# Patient Record
Sex: Male | Born: 1960 | Race: Black or African American | Hispanic: No | Smoking: Light tobacco smoker
Health system: Southern US, Community
[De-identification: ages and names within clinical notes are randomized; demographics above are authoritative.]

## PROBLEM LIST (undated history)

## (undated) DIAGNOSIS — F1910 Other psychoactive substance abuse, uncomplicated: Secondary | ICD-10-CM

## (undated) DIAGNOSIS — F319 Bipolar disorder, unspecified: Secondary | ICD-10-CM

## (undated) DIAGNOSIS — I10 Essential (primary) hypertension: Secondary | ICD-10-CM

## (undated) DIAGNOSIS — J45909 Unspecified asthma, uncomplicated: Secondary | ICD-10-CM

---

## 2011-02-08 ENCOUNTER — Emergency Department (HOSPITAL_COMMUNITY): Payer: No Typology Code available for payment source

## 2011-02-08 ENCOUNTER — Emergency Department (HOSPITAL_COMMUNITY)
Admission: EM | Admit: 2011-02-08 | Discharge: 2011-02-08 | Disposition: A | Discharge status: Home / Self Care | Payer: No Typology Code available for payment source | Attending: Emergency Medicine | Admitting: Emergency Medicine

## 2011-02-08 DIAGNOSIS — T148XXA Other injury of unspecified body region, initial encounter: Secondary | ICD-10-CM | POA: Insufficient documentation

## 2011-02-08 DIAGNOSIS — M549 Dorsalgia, unspecified: Secondary | ICD-10-CM | POA: Insufficient documentation

## 2011-02-08 DIAGNOSIS — M542 Cervicalgia: Secondary | ICD-10-CM | POA: Insufficient documentation

## 2011-07-25 ENCOUNTER — Emergency Department (HOSPITAL_COMMUNITY)
Admission: EM | Admit: 2011-07-25 | Discharge: 2011-07-26 | Disposition: A | Discharge status: Home / Self Care | Payer: Self-pay | Attending: Emergency Medicine | Admitting: Emergency Medicine

## 2011-07-25 ENCOUNTER — Emergency Department (HOSPITAL_COMMUNITY): Payer: Self-pay

## 2011-07-25 DIAGNOSIS — R079 Chest pain, unspecified: Secondary | ICD-10-CM | POA: Insufficient documentation

## 2011-07-25 DIAGNOSIS — R000 Tachycardia, unspecified: Secondary | ICD-10-CM | POA: Insufficient documentation

## 2011-07-25 DIAGNOSIS — F1010 Alcohol abuse, uncomplicated: Secondary | ICD-10-CM | POA: Insufficient documentation

## 2011-07-25 DIAGNOSIS — R0682 Tachypnea, not elsewhere classified: Secondary | ICD-10-CM | POA: Insufficient documentation

## 2011-07-25 DIAGNOSIS — IMO0002 Reserved for concepts with insufficient information to code with codable children: Secondary | ICD-10-CM | POA: Insufficient documentation

## 2011-07-25 LAB — URINALYSIS, ROUTINE W REFLEX MICROSCOPIC
Bilirubin Urine: NEGATIVE
Ketones, ur: NEGATIVE mg/dL
Nitrite: NEGATIVE
Protein, ur: NEGATIVE mg/dL
Urobilinogen, UA: 0.2 mg/dL (ref 0.0–1.0)
pH: 5.5 (ref 5.0–8.0)

## 2011-07-25 LAB — DIFFERENTIAL
Basophils Absolute: 0 10*3/uL (ref 0.0–0.1)
Eosinophils Absolute: 0 10*3/uL (ref 0.0–0.7)
Eosinophils Relative: 0 % (ref 0–5)
Lymphocytes Relative: 28 % (ref 12–46)
Monocytes Absolute: 0.2 10*3/uL (ref 0.1–1.0)

## 2011-07-25 LAB — CBC
HCT: 42.1 % (ref 39.0–52.0)
MCHC: 34.9 g/dL (ref 30.0–36.0)
Platelets: 231 10*3/uL (ref 150–400)
RDW: 12.5 % (ref 11.5–15.5)

## 2011-07-25 LAB — POCT I-STAT TROPONIN I: Troponin i, poc: 0 ng/mL (ref 0.00–0.08)

## 2011-07-25 LAB — COMPREHENSIVE METABOLIC PANEL
ALT: 21 U/L (ref 0–53)
BUN: 4 mg/dL — ABNORMAL LOW (ref 6–23)
Calcium: 9.4 mg/dL (ref 8.4–10.5)
GFR calc Af Amer: >60 mL/min (ref >60)
Glucose, Bld: 102 mg/dL — ABNORMAL HIGH (ref 70–99)
Sodium: 138 mEq/L (ref 135–145)
Total Protein: 8.1 g/dL (ref 6.0–8.3)

## 2011-07-25 LAB — RAPID URINE DRUG SCREEN, HOSP PERFORMED
Cocaine: NOT DETECTED
Opiates: NOT DETECTED

## 2011-07-25 LAB — ETHANOL: Alcohol, Ethyl (B): 279 mg/dL — ABNORMAL HIGH (ref 0–11)

## 2011-12-29 ENCOUNTER — Emergency Department (HOSPITAL_COMMUNITY)
Admission: EM | Admit: 2011-12-29 | Discharge: 2011-12-29 | Disposition: A | Discharge status: Home / Self Care | Payer: Self-pay | Attending: Emergency Medicine | Admitting: Emergency Medicine

## 2011-12-29 ENCOUNTER — Emergency Department (HOSPITAL_COMMUNITY): Payer: Self-pay

## 2011-12-29 ENCOUNTER — Encounter (HOSPITAL_COMMUNITY): Payer: Self-pay | Admitting: Emergency Medicine

## 2011-12-29 DIAGNOSIS — R093 Abnormal sputum: Secondary | ICD-10-CM | POA: Insufficient documentation

## 2011-12-29 DIAGNOSIS — R0609 Other forms of dyspnea: Secondary | ICD-10-CM | POA: Insufficient documentation

## 2011-12-29 DIAGNOSIS — R05 Cough: Secondary | ICD-10-CM | POA: Insufficient documentation

## 2011-12-29 DIAGNOSIS — R059 Cough, unspecified: Secondary | ICD-10-CM | POA: Insufficient documentation

## 2011-12-29 DIAGNOSIS — R0989 Other specified symptoms and signs involving the circulatory and respiratory systems: Secondary | ICD-10-CM | POA: Insufficient documentation

## 2011-12-29 DIAGNOSIS — F17200 Nicotine dependence, unspecified, uncomplicated: Secondary | ICD-10-CM | POA: Insufficient documentation

## 2011-12-29 DIAGNOSIS — R509 Fever, unspecified: Secondary | ICD-10-CM | POA: Insufficient documentation

## 2011-12-29 DIAGNOSIS — R61 Generalized hyperhidrosis: Secondary | ICD-10-CM | POA: Insufficient documentation

## 2011-12-29 DIAGNOSIS — J40 Bronchitis, not specified as acute or chronic: Secondary | ICD-10-CM | POA: Insufficient documentation

## 2011-12-29 HISTORY — DX: Essential (primary) hypertension: I10

## 2011-12-29 MED ORDER — ALBUTEROL SULFATE HFA 108 (90 BASE) MCG/ACT IN AERS
2.0000 | INHALATION_SPRAY | RESPIRATORY_TRACT | Status: DC | PRN
  Administered 2011-12-29: 2 via RESPIRATORY_TRACT
  Filled 2011-12-29: qty 6.7

## 2011-12-29 MED ORDER — PREDNISONE 20 MG PO TABS: 60.0000 mg | ORAL_TABLET | Freq: Every day | ORAL | Status: DC

## 2011-12-29 MED ORDER — PREDNISONE 20 MG PO TABS
60.0000 mg | ORAL_TABLET | Freq: Once | ORAL | Status: AC
  Administered 2011-12-29: 60 mg via ORAL
  Filled 2011-12-29: qty 3

## 2011-12-29 MED ORDER — ALBUTEROL SULFATE (5 MG/ML) 0.5% IN NEBU
2.5000 mg | INHALATION_SOLUTION | Freq: Once | RESPIRATORY_TRACT | Status: AC
Start: 2011-12-29 — End: 2011-12-29
  Administered 2011-12-29: 2.5 mg via RESPIRATORY_TRACT
  Filled 2011-12-29: qty 0.5

## 2011-12-29 NOTE — ED Notes (Signed)
Pt taking albuterol breathing treatment.  Alert oriented x4

## 2011-12-29 NOTE — ED Notes (Signed)
Pt requesting consult from case management for medication as he cannot afford to buy any.

## 2011-12-29 NOTE — ED Notes (Signed)
Pt states he feels better after breathing treatment.

## 2011-12-29 NOTE — ED Notes (Signed)
MD at bedside. 

## 2011-12-29 NOTE — ED Provider Notes (Signed)
History     CSN: [id number]  Arrival date & time 12/29/11  0744   First MD Initiated Contact with Patient 12/29/11 [phone]      Chief Complaint  Patient presents with  . Cough    (Consider location/radiation/quality/duration/timing/severity/associated sxs/prior treatment) Patient is a 51 y.o. male presenting with cough. The history is provided by the patient.  Cough  He had a cough for the last 3-4 days which is productive of a moderate amount of yellow sputum. He denies fever or chills, but has broken out in sweats. He denies arthralgias or myalgias. He denies nausea, vomiting, diarrhea. He denies any chest pain. Symptoms have been worsening. He feels dyspneic only well having a coughing paroxysm. He has not done anything for his cough except for taking a dose of Tylenol. Nothing makes the symptoms better nothing makes them worse. He does admit to smoking about one pack of cigarettes a week.  No past medical history on file.  No past surgical history on file.  No family history on file.  History  Substance Use Topics  . Smoking status: Not on file  . Smokeless tobacco: Not on file  . Alcohol Use: Not on file      Review of Systems  Respiratory: Positive for cough.   All other systems reviewed and are negative.    Allergies  Review of patient's allergies indicates not on file.  Home Medications  No current outpatient prescriptions on file.  There were no vitals taken for this visit.  Physical Exam  Nursing note and vitals reviewed.  71-year-old male who is resting comfortably and in no acute distress. Vital signs are significant for moderate hypertension with blood pressure 162/105. Oxygen saturation is 99% which is normal. Head is normocephalic and atraumatic. PERRLA, EOMI. Oropharynx is clear. Neck is nontender and supple without adenopathy or JVD. Back is nontender. Lungs are clear without rales, wheezes, or rhonchi. There is no p I., full range of motion is present.  Skin is warm and dry without rash. Neurologic: Mental status is normal, cranial nerves are intact, there no focal motor or sensory deficits. olongation of exhalation phase. Heart has regular rate and rhythm without murmur. There is no chest wall tenderness. Abdomen is soft, flat, nontender without masses or hepatosplenomegaly. Extremities have no cyanosis or edema  ED Course  Procedures (including critical care time)  Dg Chest 2 View  12/29/2011  *RADIOLOGY REPORT*  Clinical Data: Cough and fever  CHEST - 2 VIEW  Comparison: Chest radiograph 07/25/2011  Findings: Normal mediastinum and cardiac silhouette.  Normal pulmonary  vasculature.  No evidence of effusion, infiltrate, or pneumothorax.  No acute bony abnormality.  IMPRESSION: Normal chest radiograph  Original Report Authenticated By: Genevive Bi, M.D.    He was given an albuterol nebulizer treatment with significant subjective improvement. He will be discharged with a prescription for prednisone once a day for 5 days, and he will be given an albuterol inhaler to use 2 puffs every 4 hours as needed. He is encouraged to stop smoking.   1. Bronchitis       MDM  Probable episode of bronchitis. He will be given albuterol nebulizer treatment to assess its response of symptoms, and chest x-ray will be obtained to rule out pneumonia.        Dione Booze, MD 12/29/11 [phone]

## 2011-12-29 NOTE — ED Notes (Signed)
Pt co cough for three days.  Unable to catch breath at times.  CO headache, chest and dizzyness

## 2012-01-02 ENCOUNTER — Encounter (HOSPITAL_COMMUNITY): Payer: Self-pay

## 2012-01-02 ENCOUNTER — Emergency Department (HOSPITAL_COMMUNITY)
Admission: EM | Admit: 2012-01-02 | Discharge: 2012-01-02 | Disposition: A | Discharge status: Home / Self Care | Payer: Self-pay | Attending: Emergency Medicine | Admitting: Emergency Medicine

## 2012-01-02 ENCOUNTER — Emergency Department (HOSPITAL_COMMUNITY): Payer: Self-pay

## 2012-01-02 DIAGNOSIS — R61 Generalized hyperhidrosis: Secondary | ICD-10-CM | POA: Insufficient documentation

## 2012-01-02 DIAGNOSIS — R070 Pain in throat: Secondary | ICD-10-CM | POA: Insufficient documentation

## 2012-01-02 DIAGNOSIS — J3489 Other specified disorders of nose and nasal sinuses: Secondary | ICD-10-CM | POA: Insufficient documentation

## 2012-01-02 DIAGNOSIS — R51 Headache: Secondary | ICD-10-CM | POA: Insufficient documentation

## 2012-01-02 DIAGNOSIS — I10 Essential (primary) hypertension: Secondary | ICD-10-CM | POA: Insufficient documentation

## 2012-01-02 DIAGNOSIS — R0602 Shortness of breath: Secondary | ICD-10-CM | POA: Insufficient documentation

## 2012-01-02 DIAGNOSIS — R079 Chest pain, unspecified: Secondary | ICD-10-CM | POA: Insufficient documentation

## 2012-01-02 DIAGNOSIS — R6889 Other general symptoms and signs: Secondary | ICD-10-CM | POA: Insufficient documentation

## 2012-01-02 MED ORDER — ACETAMINOPHEN 325 MG PO TABS
975.0000 mg | ORAL_TABLET | Freq: Once | ORAL | Status: AC
Start: 2012-01-02 — End: 2012-01-02
  Administered 2012-01-02: 975 mg via ORAL
  Filled 2012-01-02: qty 3

## 2012-01-02 MED ORDER — SODIUM CHLORIDE 0.9 % IV BOLUS (SEPSIS)
1000.0000 mL | Freq: Once | INTRAVENOUS | Status: AC
  Administered 2012-01-02: 1000 mL via INTRAVENOUS

## 2012-01-02 MED ORDER — ACETAMINOPHEN 500 MG PO TABS: 1000.0000 mg | ORAL_TABLET | Freq: Once | ORAL | Status: DC

## 2012-01-02 MED ORDER — ACETAMINOPHEN 325 MG PO TABS: 650.0000 mg | ORAL_TABLET | Freq: Four times a day (QID) | ORAL | Status: DC | PRN

## 2012-01-02 NOTE — ED Provider Notes (Signed)
History     CSN: [id number]  Arrival date & time 01/02/12  7829   First MD Initiated Contact with Patient 01/02/12 [phone]      Chief Complaint  Patient presents with  . Headache  . Hypertension  . Shortness of Breath    (Consider location/radiation/quality/duration/timing/severity/associated sxs/prior treatment) Patient is a 51 y.o. male presenting with cough. The history is provided by the patient.  Cough This is a new problem. Episode onset: 1 week ago. Episode frequency: frequently. The problem has been gradually worsening. The cough is productive of purulent sputum. Maximum temperature: subjective. Associated symptoms include sweats, headaches, rhinorrhea, sore throat and shortness of breath (after coughing fits). Pertinent negatives include no chest pain and no ear congestion. Treatments tried: tylenol. The treatment provided mild relief. He is a smoker.    Past Medical History  Diagnosis Date  . Hypertension     History reviewed. No pertinent past surgical history.  History reviewed. No pertinent family history.  History  Substance Use Topics  . Smoking status: Never Smoker   . Smokeless tobacco: Not on file  . Alcohol Use: Yes      Review of Systems  Constitutional: Positive for diaphoresis.  HENT: Positive for sore throat and rhinorrhea. Negative for congestion.   Respiratory: Positive for cough and shortness of breath (after coughing fits).   Cardiovascular: Negative for chest pain.  Gastrointestinal: Negative for nausea, vomiting, abdominal pain and diarrhea.  Genitourinary: Negative for difficulty urinating.  Neurological: Positive for headaches.  All other systems reviewed and are negative.    Allergies  Review of patient's allergies indicates no known allergies.  Home Medications  No current outpatient prescriptions on file.  BP 111/71  Pulse 119  Temp(Src) 98.1 F (36.7 C) (Oral)  Resp 20  SpO2 95%  Physical Exam  Nursing note and vitals  reviewed. Constitutional: He is oriented to person, place, and time. He appears well-developed and well-nourished. No distress.  HENT:  Head: Normocephalic and atraumatic.  Mouth/Throat: Oropharynx is clear and moist.  Eyes: Conjunctivae are normal. Pupils are equal, round, and reactive to light. Right eye exhibits no discharge. Left eye exhibits no discharge. No scleral icterus.  Neck: Normal range of motion. Neck supple. No Brudzinski's sign and no Kernig's sign noted.  Cardiovascular: Normal rate, regular rhythm, normal heart sounds and intact distal pulses.   No murmur heard. Pulmonary/Chest: Effort normal and breath sounds normal. No stridor. No respiratory distress. He has no wheezes. He has no rales.  Abdominal: Soft. He exhibits no distension. There is no tenderness.  Musculoskeletal: Normal range of motion. He exhibits no edema.  Neurological: He is alert and oriented to person, place, and time. He has normal strength. No cranial nerve deficit or sensory deficit. He displays a negative Romberg sign.  Skin: Skin is warm and dry. No rash noted.  Psychiatric: He has a normal mood and affect. His behavior is normal.    ED Course  Procedures (including critical care time)  Labs Reviewed - No data to display Dg Chest 2 View  01/02/2012  *RADIOLOGY REPORT*  Clinical Data: 51 year old male with chest pain, cough, shortness of breath.  CHEST - 2 VIEW  Comparison: Cervical radiographs 02/08/2011.  Findings: Normal lung volumes. Normal cardiac size and mediastinal contours.  Visualized tracheal air column is within normal limits. The lungs are clear.  No pneumothorax or effusion.  Mild scoliosis. No acute osseous abnormality identified.  IMPRESSION: Negative, no acute cardiopulmonary abnormality.  Original Report Authenticated By:  H.LEE HALL III, M.D.   All radiology studies independently viewed by me.      1. Flu-like symptoms       MDM  51 yo male with cough, headache, and myalgias  for approx 1 week.  Mildly tachycardic on arrival, but not septic appearing. Afebrile, but complains of sweats at home. Gets short of breath after coughing fits.  Symptoms and appearance consistent with influenza.  No neck stiffness, meningeal signs, or concern for meningitis.  Denies IV drug use or STD exposures.  IV fluids, acetaminophen, and CXR.    Pt feels improved after fluids and acetaminophen.  CXR negative.  Ambulated without difficulty.  DC'd home.  Return precautions given.        Warnell Forester, MD 01/02/12 1435

## 2012-01-02 NOTE — Discharge Instructions (Signed)
 RESOURCE GUIDE  Dental Problems  Patients with Medicaid: [facility]                     [facility] [address]                                           [address] Phone:  [phone]                                                  Phone:  [phone]  If unable to pay or uninsured, contact:  [facility] or [facility] to become qualified for the adult dental clinic.  Chronic Pain Problems Contact [facility]  [phone] Patients need to be referred by their primary care doctor.  Insufficient Money for Medicine Contact [employer]:  call "211" or [facility] 271-5999.  No Primary Care Doctor Call [facility]  832-8000 Other agencies that provide inexpensive medical care    [facility]  [phone]    [facility]  [phone]    [facility]  [phone]    [facility] Clinic  832-4777    [facility]  [phone]    [facility]  [phone]  [facility]  Health  [phone] [facility]  [phone] [facility]   800 853-5163 (emergency services 641-4993)  Substance Abuse Resources [facility]  336-882-2125 [facility] 336-784-9470 [facility] 336-285-9073 [facility] 336-845-3988 Residential & Outpatient Substance Abuse Program  800-659-3381  Abuse/Neglect [place] Child Abuse Hotline (336) 641-3795 [place] Child Abuse Hotline 800-378-5315 (After Hours)  Emergency Shelter [facility] (336) 271-5985  [facility] (336) 275-9566 [facility] (704) 372-4663  MRSA Hotline #:   832-7006    [place] [facility]  [facility] [place]     [employer]                          [facility] [address] [place]                       [address]      [address]  [place]                                                [place]                            [place] Phone:  [phone]                                   Phone:  [phone]                 Phone:  [phone]  [facility] Phone:    [phone]  [place] Child Abuse Hotline (336) 342-1394 (336) 342-3537 (After Hours)   

## 2012-01-02 NOTE — ED Notes (Signed)
Patient transported to X-ray 

## 2012-01-02 NOTE — ED Notes (Signed)
Dr. Ignacia Felling at pt bedside.

## 2012-01-02 NOTE — ED Notes (Signed)
Pt complains of headache, body aches, sob and difficulty breathing and htn

## 2012-01-02 NOTE — ED Notes (Signed)
While ambulating patient stated he felt a little dizzy suggested he hold his head up and not close his eyes.  Other wise patient did well

## 2012-01-03 NOTE — ED Provider Notes (Signed)
I saw and evaluated the patient, reviewed the resident's note and I agree with the findings and plan.   .Face to face Exam:  General:  Awake HEENT:  Atraumatic Resp:  Normal effort Abd:  Nondistended Neuro:No focal weakness Lymph: No adenopathy   Nelia Shi, MD 01/03/12 1055

## 2012-06-25 ENCOUNTER — Emergency Department (HOSPITAL_COMMUNITY)
Admission: EM | Admit: 2012-06-25 | Discharge: 2012-06-25 | Payer: Self-pay | Attending: Emergency Medicine | Admitting: Emergency Medicine

## 2012-06-25 DIAGNOSIS — F919 Conduct disorder, unspecified: Secondary | ICD-10-CM | POA: Insufficient documentation

## 2012-06-25 MED ORDER — ZIPRASIDONE MESYLATE 20 MG IM SOLR: 20.0000 mg | Freq: Once | INTRAMUSCULAR | Status: DC
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
## 2012-11-08 ENCOUNTER — Emergency Department (HOSPITAL_COMMUNITY): Payer: Self-pay

## 2012-11-08 ENCOUNTER — Emergency Department (HOSPITAL_COMMUNITY)
Admission: EM | Admit: 2012-11-08 | Discharge: 2012-11-08 | Disposition: A | Discharge status: Home / Self Care | Payer: Self-pay | Attending: Emergency Medicine | Admitting: Emergency Medicine

## 2012-11-08 ENCOUNTER — Encounter (HOSPITAL_COMMUNITY): Payer: Self-pay | Admitting: Emergency Medicine

## 2012-11-08 DIAGNOSIS — R05 Cough: Secondary | ICD-10-CM | POA: Insufficient documentation

## 2012-11-08 DIAGNOSIS — Z791 Long term (current) use of non-steroidal anti-inflammatories (NSAID): Secondary | ICD-10-CM | POA: Insufficient documentation

## 2012-11-08 DIAGNOSIS — K047 Periapical abscess without sinus: Secondary | ICD-10-CM | POA: Insufficient documentation

## 2012-11-08 DIAGNOSIS — R059 Cough, unspecified: Secondary | ICD-10-CM | POA: Insufficient documentation

## 2012-11-08 DIAGNOSIS — I10 Essential (primary) hypertension: Secondary | ICD-10-CM | POA: Insufficient documentation

## 2012-11-08 DIAGNOSIS — J45909 Unspecified asthma, uncomplicated: Secondary | ICD-10-CM | POA: Insufficient documentation

## 2012-11-08 DIAGNOSIS — Z79899 Other long term (current) drug therapy: Secondary | ICD-10-CM | POA: Insufficient documentation

## 2012-11-08 HISTORY — DX: Unspecified asthma, uncomplicated: J45.909

## 2012-11-08 MED ORDER — KETOROLAC TROMETHAMINE 60 MG/2ML IM SOLN
60.0000 mg | Freq: Once | INTRAMUSCULAR | Status: AC
  Administered 2012-11-08: 60 mg via INTRAMUSCULAR
  Filled 2012-11-08: qty 2

## 2012-11-08 MED ORDER — AMOXICILLIN 500 MG PO CAPS: 500.0000 mg | ORAL_CAPSULE | Freq: Three times a day (TID) | ORAL | Status: DC

## 2012-11-08 MED ORDER — NAPROXEN 500 MG PO TABS: 500.0000 mg | ORAL_TABLET | Freq: Two times a day (BID) | ORAL | Status: DC

## 2012-11-08 MED ORDER — AMOXICILLIN 500 MG PO CAPS
500.0000 mg | ORAL_CAPSULE | Freq: Once | ORAL | Status: AC
  Administered 2012-11-08: 500 mg via ORAL
  Filled 2012-11-08: qty 1

## 2012-11-08 NOTE — ED Notes (Signed)
Per ems. Pt reports burning chest pain and wheezing. Lungs clear upon auscultation. Pt also reports to headache with abscess to left lower mouth.

## 2012-11-08 NOTE — ED Provider Notes (Signed)
History     CSN: [id number]  Arrival date & time 11/08/12  0040   None     Chief Complaint  Patient presents with  . Chest Pain    (Consider location/radiation/quality/duration/timing/severity/associated sxs/prior treatment) HPI Comments: 51 year old male with a history of hypertension who presents with a complaint of left lower jaw pain, swelling and a headache. He does have associated coughing for the last couple of weeks and states that when he coughs his chest hurts. He denies productive cough, fevers chills nausea or vomiting. He did awake with diaphoresis with his jaw hurting this evening but denies objective measured fevers. The symptoms are persistent, gradually worsening, jaw pain has been present for 3 days. The jaw hurts on the left lower jaw at the location of the swelling.  Patient is a 51 y.o. male presenting with chest pain. The history is provided by the patient and the EMS personnel.  Chest Pain     Past Medical History  Diagnosis Date  . Hypertension   . Asthma     History reviewed. No pertinent past surgical history.  No family history on file.  History  Substance Use Topics  . Smoking status: Never Smoker   . Smokeless tobacco: Not on file  . Alcohol Use: Yes      Review of Systems  Cardiovascular: Positive for chest pain.  All other systems reviewed and are negative.    Allergies  Review of patient's allergies indicates no known allergies.  Home Medications   Current Outpatient Rx  Name  Route  Sig  Dispense  Refill  . IBUPROFEN 200 MG PO TABS   Oral   Take 200 mg by mouth every 6 (six) hours as needed. For pain         . AMOXICILLIN 500 MG PO CAPS   Oral   Take 1 capsule (500 mg total) by mouth 3 (three) times daily.   30 capsule   0   . NAPROXEN 500 MG PO TABS   Oral   Take 1 tablet (500 mg total) by mouth 2 (two) times daily with a meal.   30 tablet   0     BP 177/97  Pulse 73  Resp 19  SpO2 97%  Physical Exam   Nursing note and vitals reviewed. Constitutional: He appears well-developed and well-nourished. No distress.  HENT:  Head: Normocephalic and atraumatic.  Mouth/Throat: Oropharynx is clear and moist. No oropharyngeal exudate.       Left lower dental abscess, no palpable area of fluctuance but mild asymmetry to the lower jaw. poor dentition. No tenderness underneath the tongue  Eyes: Conjunctivae normal and EOM are normal. Pupils are equal, round, and reactive to light. Right eye exhibits no discharge. Left eye exhibits no discharge. No scleral icterus.  Neck: Normal range of motion. Neck supple. No JVD present. No thyromegaly present.       No lymphadenopathy, very supple  Cardiovascular: Normal rate, regular rhythm, normal heart sounds and intact distal pulses.  Exam reveals no gallop and no friction rub.   No murmur heard. Pulmonary/Chest: Effort normal and breath sounds normal. No respiratory distress. He has no wheezes. He has no rales.  Abdominal: Soft. Bowel sounds are normal. He exhibits no distension and no mass. There is no tenderness.  Musculoskeletal: Normal range of motion. He exhibits no edema and no tenderness.  Lymphadenopathy:    He has no cervical adenopathy.  Neurological: He is alert. Coordination normal.  Skin: Skin is warm  and dry. No rash noted. No erythema.  Psychiatric: He has a normal mood and affect. His behavior is normal.    ED Course  Procedures (including critical care time)  Labs Reviewed - No data to display Dg Chest 2 View  11/08/2012  *RADIOLOGY REPORT*  Clinical Data: Shortness of breath, cough.  CHEST - 2 VIEW  Comparison: 01/02/2012  Findings: Lungs are predominately clear. Small nodular density projecting over each lower lung is favored to correspond to a nipple shadow.  No pleural effusion or pneumothorax. The cardiomediastinal contours are within normal limits. The visualized bones and soft tissues are without significant appreciable abnormality.   IMPRESSION: No radiographic evidence of acute cardiopulmonary process.  Small nodular density projecting over each lower lobe likely correspond to nipple shadows and could be confirmed with a follow- up with nipple markers.   Original Report Authenticated By: Jearld Lesch, M.D.      1. Cough   2. Dental abscess   3. Hypertension       MDM  The patient has normal lung sounds, normal cardiac sounds and has an EKG tracing from EMS that shows normal sinus rhythm with no signs of ST abnormalities. I suspect that his chest pain is related to his cough. There are no abnormal lung sounds, chest x-ray to rule out source of cough including pneumonia or mass or pneumothorax. We'll also obtain EKG to confirm paramedics tracing, antibiotics for dental abscess.   The patient has had a chest x-ray which shows no significant findings, his symptoms have improved with medications, he will go home on amoxicillin and has been given a dental referral. Patient stable for discharge     Vida Roller, MD 11/08/12 [phone]

## 2012-11-09 ENCOUNTER — Encounter (HOSPITAL_COMMUNITY): Payer: Self-pay | Admitting: *Deleted

## 2012-11-09 ENCOUNTER — Emergency Department (HOSPITAL_COMMUNITY)
Admission: EM | Admit: 2012-11-09 | Discharge: 2012-11-09 | Disposition: A | Discharge status: Home / Self Care | Payer: Self-pay | Attending: Emergency Medicine | Admitting: Emergency Medicine

## 2012-11-09 DIAGNOSIS — I10 Essential (primary) hypertension: Secondary | ICD-10-CM | POA: Insufficient documentation

## 2012-11-09 DIAGNOSIS — K047 Periapical abscess without sinus: Secondary | ICD-10-CM | POA: Insufficient documentation

## 2012-11-09 DIAGNOSIS — Z79899 Other long term (current) drug therapy: Secondary | ICD-10-CM | POA: Insufficient documentation

## 2012-11-09 DIAGNOSIS — J45909 Unspecified asthma, uncomplicated: Secondary | ICD-10-CM | POA: Insufficient documentation

## 2012-11-09 DIAGNOSIS — R220 Localized swelling, mass and lump, head: Secondary | ICD-10-CM | POA: Insufficient documentation

## 2012-11-09 DIAGNOSIS — R51 Headache: Secondary | ICD-10-CM | POA: Insufficient documentation

## 2012-11-09 DIAGNOSIS — F17200 Nicotine dependence, unspecified, uncomplicated: Secondary | ICD-10-CM | POA: Insufficient documentation

## 2012-11-09 MED ORDER — BUPIVACAINE HCL (PF) 0.5 % IJ SOLN
10.0000 mL | Freq: Once | INTRAMUSCULAR | Status: AC
  Administered 2012-11-09: 10 mL
  Filled 2012-11-09: qty 30

## 2012-11-09 MED ORDER — PENICILLIN V POTASSIUM 500 MG PO TABS: 500.0000 mg | ORAL_TABLET | Freq: Four times a day (QID) | ORAL | Status: AC

## 2012-11-09 MED ORDER — PENICILLIN V POTASSIUM 500 MG PO TABS
500.0000 mg | ORAL_TABLET | Freq: Once | ORAL | Status: AC
  Administered 2012-11-09: 500 mg via ORAL
  Filled 2012-11-09: qty 1

## 2012-11-09 MED ORDER — HYDROCODONE-ACETAMINOPHEN 5-500 MG PO TABS: 1.0000 | ORAL_TABLET | Freq: Four times a day (QID) | ORAL | Status: DC | PRN

## 2012-11-09 MED ORDER — OXYCODONE-ACETAMINOPHEN 5-325 MG PO TABS
1.0000 | ORAL_TABLET | Freq: Once | ORAL | Status: AC
  Administered 2012-11-09: 1 via ORAL
  Filled 2012-11-09: qty 1

## 2012-11-09 NOTE — ED Provider Notes (Signed)
Medical screening examination/treatment/procedure(s) were performed by non-physician practitioner and as supervising physician I was immediately available for consultation/collaboration.    Ameya Vowell R Creasie Lacosse, MD 11/09/12 1620 

## 2012-11-09 NOTE — ED Notes (Signed)
Pt c/o left sided jaw pain and swelling due to a bad tooth, denies dental care, sts the tooth throbs at night
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
## 2012-11-11 ENCOUNTER — Encounter (HOSPITAL_COMMUNITY): Payer: Self-pay | Admitting: *Deleted

## 2012-11-23 ENCOUNTER — Emergency Department (HOSPITAL_COMMUNITY)
Admission: EM | Admit: 2012-11-23 | Discharge: 2012-11-23 | Disposition: A | Discharge status: Home / Self Care | Payer: Self-pay | Attending: Emergency Medicine | Admitting: Emergency Medicine

## 2012-11-23 ENCOUNTER — Encounter (HOSPITAL_COMMUNITY): Payer: Self-pay | Admitting: Emergency Medicine

## 2012-11-23 DIAGNOSIS — J45909 Unspecified asthma, uncomplicated: Secondary | ICD-10-CM

## 2012-11-23 DIAGNOSIS — F17200 Nicotine dependence, unspecified, uncomplicated: Secondary | ICD-10-CM | POA: Insufficient documentation

## 2012-11-23 DIAGNOSIS — IMO0002 Reserved for concepts with insufficient information to code with codable children: Secondary | ICD-10-CM | POA: Insufficient documentation

## 2012-11-23 DIAGNOSIS — I10 Essential (primary) hypertension: Secondary | ICD-10-CM | POA: Insufficient documentation

## 2012-11-23 DIAGNOSIS — J45901 Unspecified asthma with (acute) exacerbation: Secondary | ICD-10-CM | POA: Insufficient documentation

## 2012-11-23 MED ORDER — ALBUTEROL SULFATE HFA 108 (90 BASE) MCG/ACT IN AERS
2.0000 | INHALATION_SPRAY | RESPIRATORY_TRACT | Status: DC | PRN
  Filled 2012-11-23: qty 6.7

## 2012-11-23 NOTE — ED Notes (Addendum)
C/o sob since Tuesday.  Reports history of asthma. Pt states he is out of inhalers.  Speaking in complete sentences without difficulty.

## 2012-11-23 NOTE — ED Provider Notes (Signed)
History     CSN: [id number]  Arrival date & time 11/23/12  0401   First MD Initiated Contact with Patient 11/23/12 [phone]      Chief Complaint  Patient presents with  . Asthma   HPI  History provided by the patient. Patient is a 52 year old male with history of hypertension and asthma who presents with complaints of continued daily wheezing and asthma symptoms. Patient states he's been out of his albuterol inhaler for several weeks. He states he misplaced it and has not used any medications for his asthma symptoms. He reports feeling some upper airway wheezing and tightness that has been persistent since the beginning of the year. He denies having significant cough or shortness of breath. Denies any fever, chills or sweats. Denies any chest pain or palpitations.    Past Medical History  Diagnosis Date  . Hypertension   . Asthma     History reviewed. No pertinent past surgical history.  No family history on file.  History  Substance Use Topics  . Smoking status: Light Tobacco Smoker  . Smokeless tobacco: Not on file  . Alcohol Use: 0.6 oz/week    1 Glasses of wine per week      Review of Systems  Constitutional: Negative for fever and chills.  Respiratory: Positive for wheezing. Negative for cough and shortness of breath.   Cardiovascular: Negative for chest pain.  All other systems reviewed and are negative.    Allergies  Review of patient's allergies indicates no known allergies.  Home Medications   Current Outpatient Rx  Name  Route  Sig  Dispense  Refill  . ACETAMINOPHEN 500 MG PO TABS   Oral   Take 1,000 mg by mouth every 6 (six) hours as needed. For pain.         Marland Kitchen AMOXICILLIN 500 MG PO CAPS   Oral   Take 1 capsule (500 mg total) by mouth 3 (three) times daily.   30 capsule   0   . HYDROCODONE-ACETAMINOPHEN 5-500 MG PO TABS   Oral   Take 1-2 tablets by mouth every 6 (six) hours as needed for pain.   20 tablet   0   . NAPROXEN 500 MG PO TABS  Oral   Take 1 tablet (500 mg total) by mouth 2 (two) times daily with a meal.   30 tablet   0   . PREDNISONE 20 MG PO TABS   Oral   Take 3 tablets (60 mg total) by mouth daily.   15 tablet   0     BP 134/86  Pulse 96  Temp 97.7 F (36.5 C) (Oral)  Resp 20  SpO2 97%  Physical Exam  Nursing note and vitals reviewed. Constitutional: He is oriented to person, place, and time. He appears well-developed and well-nourished. No distress.  HENT:  Head: Normocephalic.  Cardiovascular: Normal rate and regular rhythm.   Pulmonary/Chest: Effort normal and breath sounds normal. No respiratory distress. He has no wheezes.  Abdominal: Soft. There is no tenderness.  Neurological: He is alert and oriented to person, place, and time.  Skin: Skin is warm.  Psychiatric: He has a normal mood and affect. His behavior is normal.    ED Course  Procedures      1. Asthma       MDM  4:20 AM patient seen and evaluated. Patient well-appearing in no acute distress with normal respirations.  No significant wheezing on exam. Patient has normal O2 sats on room air.  At this time we'll provide an inhaler for patient's symptoms. No other concerning symptoms the patient stable for discharge home with continued PCP followup        Angus Seller, [place] 11/23/12 [phone]

## 2012-11-23 NOTE — ED Provider Notes (Signed)
Medical screening examination/treatment/procedure(s) were performed by non-physician practitioner and as supervising physician I was immediately available for consultation/collaboration.   Lyanne Co, MD 11/23/12 [phone]

## 2012-11-30 ENCOUNTER — Emergency Department (HOSPITAL_COMMUNITY)
Admission: EM | Admit: 2012-11-30 | Discharge: 2012-12-01 | Disposition: A | Discharge status: Home / Self Care | Payer: Self-pay | Attending: Emergency Medicine | Admitting: Emergency Medicine

## 2012-11-30 ENCOUNTER — Encounter (HOSPITAL_COMMUNITY): Payer: Self-pay

## 2012-11-30 ENCOUNTER — Emergency Department (HOSPITAL_COMMUNITY): Payer: Self-pay

## 2012-11-30 DIAGNOSIS — I10 Essential (primary) hypertension: Secondary | ICD-10-CM | POA: Insufficient documentation

## 2012-11-30 DIAGNOSIS — W19XXXA Unspecified fall, initial encounter: Secondary | ICD-10-CM | POA: Insufficient documentation

## 2012-11-30 DIAGNOSIS — Y9301 Activity, walking, marching and hiking: Secondary | ICD-10-CM | POA: Insufficient documentation

## 2012-11-30 DIAGNOSIS — F1010 Alcohol abuse, uncomplicated: Secondary | ICD-10-CM | POA: Insufficient documentation

## 2012-11-30 DIAGNOSIS — F17200 Nicotine dependence, unspecified, uncomplicated: Secondary | ICD-10-CM | POA: Insufficient documentation

## 2012-11-30 DIAGNOSIS — Z79899 Other long term (current) drug therapy: Secondary | ICD-10-CM | POA: Insufficient documentation

## 2012-11-30 DIAGNOSIS — F10929 Alcohol use, unspecified with intoxication, unspecified: Secondary | ICD-10-CM

## 2012-11-30 DIAGNOSIS — S0990XA Unspecified injury of head, initial encounter: Secondary | ICD-10-CM | POA: Insufficient documentation

## 2012-11-30 DIAGNOSIS — J45909 Unspecified asthma, uncomplicated: Secondary | ICD-10-CM | POA: Insufficient documentation

## 2012-11-30 DIAGNOSIS — Y929 Unspecified place or not applicable: Secondary | ICD-10-CM | POA: Insufficient documentation

## 2012-11-30 LAB — POCT I-STAT, CHEM 8
BUN: 9 mg/dL (ref 6–23)
Calcium, Ion: 1.14 mmol/L (ref 1.12–1.23)
Chloride: 108 mEq/L (ref 96–112)
Creatinine, Ser: 1.2 mg/dL (ref 0.50–1.35)
Glucose, Bld: 87 mg/dL (ref 70–99)
Potassium: 3.8 mEq/L (ref 3.5–5.1)

## 2012-11-30 LAB — ETHANOL: Alcohol, Ethyl (B): 315 mg/dL — ABNORMAL HIGH (ref 0–11)

## 2012-11-30 MED ORDER — HALOPERIDOL LACTATE 5 MG/ML IJ SOLN
10.0000 mg | Freq: Once | INTRAMUSCULAR | Status: AC
  Administered 2012-11-30: 10 mg via INTRAMUSCULAR

## 2012-11-30 MED ORDER — HALOPERIDOL LACTATE 5 MG/ML IJ SOLN: 10.0000 mg | Freq: Once | INTRAMUSCULAR | Status: DC

## 2012-11-30 MED ORDER — THIAMINE HCL 100 MG/ML IJ SOLN
Freq: Once | INTRAVENOUS | Status: AC
  Administered 2012-11-30: 18:00:00 via INTRAVENOUS
  Filled 2012-11-30: qty 1000

## 2012-11-30 MED ORDER — HALOPERIDOL LACTATE 5 MG/ML IJ SOLN
INTRAMUSCULAR | Status: AC
  Administered 2012-11-30: 10 mg via INTRAMUSCULAR
  Filled 2012-11-30: qty 2

## 2012-11-30 MED ORDER — SODIUM CHLORIDE 0.9 % IV BOLUS (SEPSIS)
1000.0000 mL | Freq: Once | INTRAVENOUS | Status: AC
  Administered 2012-11-30: 1000 mL via INTRAVENOUS

## 2012-11-30 NOTE — ED Notes (Signed)
Patient woke up briefly. RN explained to him that he was intoxicated and had a fall. Pt denies pain currently. RN placed BP cuff and pulse oximeter on patient. NAD at this time. Patient continues to rest peacefully.

## 2012-11-30 NOTE — ED Provider Notes (Signed)
History     CSN: [id number]  Arrival date & time 11/30/12  1617   None     No chief complaint on file.   (Consider location/radiation/quality/duration/timing/severity/associated sxs/prior treatment) HPI chief complaint: Fall. Onset: Just prior to arrival. Location of injuries: Reportedly head. Context: Patient had a witnessed fall by a bystander called EMS who brought the patient to be evaluated. Patient admits to drinking 10 beers today. Patient is homeless. Patient appears intoxicated and is violent towards staff saying he wants to leave. Regarding social history see nurse's notes. Unable to obtain family history. I reviewed the patient's past medical, past surgical, social history as well as medications and allergies.  Past Medical History  Diagnosis Date  . Hypertension   . Asthma     No past surgical history on file.  No family history on file.  History  Substance Use Topics  . Smoking status: Light Tobacco Smoker  . Smokeless tobacco: Not on file  . Alcohol Use: 0.6 oz/week    1 Glasses of wine per week      Review of Systems  Unable to perform ROS: Other    Allergies  Review of patient's allergies indicates no known allergies.  Home Medications   Current Outpatient Rx  Name  Route  Sig  Dispense  Refill  . ACETAMINOPHEN 500 MG PO TABS   Oral   Take 1,000 mg by mouth every 6 (six) hours as needed. For pain.         Marland Kitchen AMOXICILLIN 500 MG PO CAPS   Oral   Take 1 capsule (500 mg total) by mouth 3 (three) times daily.   30 capsule   0   . HYDROCODONE-ACETAMINOPHEN 5-500 MG PO TABS   Oral   Take 1-2 tablets by mouth every 6 (six) hours as needed for pain.   20 tablet   0   . NAPROXEN 500 MG PO TABS   Oral   Take 1 tablet (500 mg total) by mouth 2 (two) times daily with a meal.   30 tablet   0   . PREDNISONE 20 MG PO TABS   Oral   Take 40 mg by mouth daily.           There were no vitals taken for this visit.  Physical Exam  ED Course    Procedures (including critical care time)  Labs Reviewed  ETHANOL - Abnormal; Notable for the following:    Alcohol, Ethyl (B) 315 (*)     All other components within normal limits  POCT I-STAT, CHEM 8   Ct Head Wo Contrast  11/30/2012  *RADIOLOGY REPORT*  Clinical Data:  52 year old male status post fall.  Intoxication. Head injury.  CT HEAD WITHOUT CONTRAST CT CERVICAL SPINE WITHOUT CONTRAST  Technique:  Multidetector CT imaging of the head and cervical spine was performed following the standard protocol without intravenous contrast.  Multiplanar CT image reconstructions of the cervical spine were also generated.  Comparison:  Cervical spine CT 02/08/2011.  CT HEAD  Findings: No focal scalp hematoma.  Disconjugate gaze, otherwise negative orbit soft tissues.  Mild paranasal sinus mucosal thickening.  Mastoids are clear.  Calvarium intact.  No restricted diffusion to suggest acute infarction.  No midline shift, mass effect, evidence of mass lesion, ventriculomegaly, extra-axial collection or acute intracranial hemorrhage. Cervicomedullary junction and pituitary are within normal limits. No suspicious intracranial vascular hyperdensity.  IMPRESSION: 1. Normal noncontrast CT appearance of the brain. 2.  Cervical spine findings below.  CT CERVICAL SPINE  Findings: Chronic straightening of cervical lordosis. Visualized skull base is intact.  No atlanto-occipital dissociation. Cervicothoracic junction alignment is within normal limits. Bilateral posterior element alignment is within normal limits. Multilevel chronic disc and endplate degeneration, maximal at C4-C5 where mild to moderate spinal stenosis is suspected.  No acute cervical fracture.  Negative lung apices except for mild scarring. Grossly intact visualized upper thoracic levels.  Small volume retained secretions in the trachea at the thoracic inlet. Visualized paraspinal soft tissues are within normal limits.  IMPRESSION: 1. No acute fracture or  listhesis identified in the cervical spine. Ligamentous injury is not excluded. 2.  Chronic degenerative changes including disc degeneration primarily responsible for mild to moderate spinal stenosis at C4- C5. 3.  Small volume retained secretions in the trachea.   Original Report Authenticated By: Erskine Speed, M.D.    Ct Cervical Spine Wo Contrast  11/30/2012  *RADIOLOGY REPORT*  Clinical Data:  52 year old male status post fall.  Intoxication. Head injury.  CT HEAD WITHOUT CONTRAST CT CERVICAL SPINE WITHOUT CONTRAST  Technique:  Multidetector CT imaging of the head and cervical spine was performed following the standard protocol without intravenous contrast.  Multiplanar CT image reconstructions of the cervical spine were also generated.  Comparison:  Cervical spine CT 02/08/2011.  CT HEAD  Findings: No focal scalp hematoma.  Disconjugate gaze, otherwise negative orbit soft tissues.  Mild paranasal sinus mucosal thickening.  Mastoids are clear.  Calvarium intact.  No restricted diffusion to suggest acute infarction.  No midline shift, mass effect, evidence of mass lesion, ventriculomegaly, extra-axial collection or acute intracranial hemorrhage. Cervicomedullary junction and pituitary are within normal limits. No suspicious intracranial vascular hyperdensity.  IMPRESSION: 1. Normal noncontrast CT appearance of the brain. 2.  Cervical spine findings below.  CT CERVICAL SPINE  Findings: Chronic straightening of cervical lordosis. Visualized skull base is intact.  No atlanto-occipital dissociation. Cervicothoracic junction alignment is within normal limits. Bilateral posterior element alignment is within normal limits. Multilevel chronic disc and endplate degeneration, maximal at C4-C5 where mild to moderate spinal stenosis is suspected.  No acute cervical fracture.  Negative lung apices except for mild scarring. Grossly intact visualized upper thoracic levels.  Small volume retained secretions in the trachea at  the thoracic inlet. Visualized paraspinal soft tissues are within normal limits.  IMPRESSION: 1. No acute fracture or listhesis identified in the cervical spine. Ligamentous injury is not excluded. 2.  Chronic degenerative changes including disc degeneration primarily responsible for mild to moderate spinal stenosis at C4- C5. 3.  Small volume retained secretions in the trachea.   Original Report Authenticated By: Erskine Speed, M.D.      1. Alcohol intoxication   2. Fall       MDM  This is a 52 year old black male brought by EMS after he was witnessed to have a fall. Patient admits to 10 beers today. Patient is angry and swinging at staff members stating he did not want to be brought to the emergency department. Small hematoma over the frontal forehead. IM Haldol provided for sedation. Plan for CT imaging of the head and neck. Cervical collar ordered.  Imaging of the head and neck is unremarkable. Labs large unremarkable. IV fluids infusing. Banana bag given. We'll give patient time to metabolize and reassess.  Cervical collar cleared prior to discharge. Ambulated independently prior to discharge. Patient did have excellent range of motion in all directions without pain. I do not feel the patient has any secondary  injuries the cause of his combativeness on arrival.          Consuello Masse, MD 11/30/12 2321

## 2012-11-30 NOTE — Progress Notes (Signed)
Orthopedic Tech Progress Note Patient Details:  Stuart Bell 1961/09/20 [id number]  Ortho Devices Type of Ortho Device: Soft collar Ortho Device/Splint Interventions: Ordered Collar is in ER at [facility]. Not yet applied.   Leo Grosser T 11/30/2012, 6:29 PM
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
## 2012-11-30 NOTE — ED Notes (Signed)
I gave the patient a happy meal, a sprite, and 2 packs of graham crackers. 

## 2012-12-02 NOTE — ED Notes (Signed)
I saw and evaluated the patient, reviewed the resident's note and I agree with the findings and plan.   .Face to face Exam:  General:  Awake Resp:  Normal effort Abd:  Nondistended Neuro:No focal weakness Lymph: No adenopathy   Nelia Shi, MD 12/02/12 1124

## 2012-12-02 NOTE — ED Provider Notes (Deleted)
Medical screening examination/treatment/procedure(s) were performed by non-physician practitioner and as supervising physician I was immediately available for consultation/collaboration.    Nelia Shi, MD 12/02/12 1122

## 2013-11-20 DIAGNOSIS — F319 Bipolar disorder, unspecified: Secondary | ICD-10-CM

## 2013-11-20 HISTORY — DX: Bipolar disorder, unspecified: F31.9

## 2014-08-25 ENCOUNTER — Emergency Department (HOSPITAL_COMMUNITY)
Admission: EM | Admit: 2014-08-25 | Discharge: 2014-08-25 | Disposition: A | Discharge status: Home / Self Care | Payer: Medicare Other | Attending: Emergency Medicine | Admitting: Emergency Medicine

## 2014-08-25 ENCOUNTER — Encounter (HOSPITAL_COMMUNITY): Payer: Self-pay | Admitting: Emergency Medicine

## 2014-08-25 DIAGNOSIS — J45909 Unspecified asthma, uncomplicated: Secondary | ICD-10-CM | POA: Insufficient documentation

## 2014-08-25 DIAGNOSIS — N451 Epididymitis: Secondary | ICD-10-CM | POA: Insufficient documentation

## 2014-08-25 DIAGNOSIS — R109 Unspecified abdominal pain: Secondary | ICD-10-CM | POA: Diagnosis present

## 2014-08-25 DIAGNOSIS — A64 Unspecified sexually transmitted disease: Secondary | ICD-10-CM | POA: Insufficient documentation

## 2014-08-25 DIAGNOSIS — Z720 Tobacco use: Secondary | ICD-10-CM | POA: Insufficient documentation

## 2014-08-25 DIAGNOSIS — I10 Essential (primary) hypertension: Secondary | ICD-10-CM | POA: Insufficient documentation

## 2014-08-25 DIAGNOSIS — R21 Rash and other nonspecific skin eruption: Secondary | ICD-10-CM | POA: Diagnosis not present

## 2014-08-25 LAB — URINALYSIS, ROUTINE W REFLEX MICROSCOPIC
Glucose, UA: NEGATIVE mg/dL
Hgb urine dipstick: NEGATIVE
Ketones, ur: 15 mg/dL — AB
Nitrite: NEGATIVE
PROTEIN: NEGATIVE mg/dL
Specific Gravity, Urine: 1.031 — ABNORMAL HIGH (ref 1.005–1.030)
UROBILINOGEN UA: 1 mg/dL (ref 0.0–1.0)
pH: 5.5 (ref 5.0–8.0)

## 2014-08-25 LAB — URINE MICROSCOPIC-ADD ON

## 2014-08-25 LAB — RPR

## 2014-08-25 MED ORDER — DOXYCYCLINE HYCLATE 100 MG PO CAPS: 100.0000 mg | ORAL_CAPSULE | Freq: Two times a day (BID) | ORAL | Status: DC

## 2014-08-25 MED ORDER — CEFTRIAXONE SODIUM 250 MG IJ SOLR
250.0000 mg | Freq: Once | INTRAMUSCULAR | Status: AC
  Administered 2014-08-25: 250 mg via INTRAMUSCULAR
  Filled 2014-08-25: qty 250

## 2014-08-25 MED ORDER — IBUPROFEN 800 MG PO TABS: 800.0000 mg | ORAL_TABLET | Freq: Three times a day (TID) | ORAL | Status: DC | PRN

## 2014-08-25 MED ORDER — AZITHROMYCIN 250 MG PO TABS
1000.0000 mg | ORAL_TABLET | Freq: Once | ORAL | Status: AC
  Administered 2014-08-25: 1000 mg via ORAL
  Filled 2014-08-25: qty 4

## 2014-08-25 NOTE — ED Notes (Signed)
MD at the bedside  

## 2014-08-25 NOTE — ED Notes (Signed)
Pt c/o pain in right groin area; pt sts rash to neck and chest; pt denies dysuria; pt sts thinks "he may have caught at disease"

## 2014-08-25 NOTE — ED Provider Notes (Signed)
Medical screening examination/treatment/procedure(s) were performed by non-physician practitioner and as supervising physician I was immediately available for consultation/collaboration.   EKG Interpretation None        Gilda Creasehristopher J. Ortha Metts, MD 08/25/14 1454

## 2014-08-25 NOTE — Discharge Instructions (Signed)
Return here as needed.  Followup with a primary care Dr. °

## 2014-08-25 NOTE — ED Provider Notes (Signed)
CSN: [id number]     Arrival date & time 08/25/14  1149 History   First MD Initiated Contact with Patient 08/25/14 1351     Chief Complaint  Patient presents with  . Groin Pain     (Consider location/radiation/quality/duration/timing/severity/associated sxs/prior Treatment) HPI Patient presents to the emergency department with right scrotal pain.  The patient, states, that he thinks he may have caught a disease from having unprotected sex.  Patient, states, that he does not have any burning with urination, or discharge, but has pain at the top of his right testicle.  Patient, states he doesn't have any abdominal pain, nausea, vomiting, diarrhea, penile discharge, fever, or syncope.  The patient, states, that did not take any medications prior to arrival other than Tylenol. Past Medical History  Diagnosis Date  . Hypertension   . Asthma    History reviewed. No pertinent past surgical history. History reviewed. No pertinent family history. History  Substance Use Topics  . Smoking status: Light Tobacco Smoker  . Smokeless tobacco: Not on file  . Alcohol Use: 0.0 oz/week    Review of Systems All other systems negative except as documented in the HPI. All pertinent positives and negatives as reviewed in the HPI.    Allergies  Review of patient's allergies indicates no known allergies.  Home Medications   Prior to Admission medications   Medication Sig Start Date End Date Taking? Authorizing Provider  acetaminophen (TYLENOL) 500 MG tablet Take 1,000 mg by mouth every 6 (six) hours as needed. For pain.   Yes Historical Provider, MD   BP 136/76  Pulse 71  Temp(Src) 98.4 F (36.9 C) (Oral)  Resp 16  SpO2 96% Physical Exam  Constitutional: He is oriented to person, place, and time. He appears well-developed and well-nourished. No distress.  HENT:  Head: Normocephalic and atraumatic.  Mouth/Throat: Oropharynx is clear and moist.  Eyes: Pupils are equal, round, and reactive to  light.  Neck: Normal range of motion. Neck supple.  Cardiovascular: Normal rate, regular rhythm and normal heart sounds.   Pulmonary/Chest: Effort normal and breath sounds normal.  Abdominal: Soft. Bowel sounds are normal. He exhibits no distension. There is no tenderness.  Genitourinary:    Right testis shows tenderness. Right testis shows no mass and no swelling. Right testis is descended. Cremasteric reflex is not absent on the right side. Left testis shows no mass, no swelling and no tenderness. Left testis is descended. Cremasteric reflex is not absent on the left side. No penile tenderness. No discharge found.  Neurological: He is alert and oriented to person, place, and time. He exhibits normal muscle tone. Coordination normal.  Skin: Rash noted.    ED Course  Procedures (including critical care time) Labs Review Labs Reviewed  URINALYSIS, ROUTINE W REFLEX MICROSCOPIC - Abnormal; Notable for the following:    Color, Urine AMBER (*)    APPearance CLOUDY (*)    Specific Gravity, Urine 1.031 (*)    Bilirubin Urine SMALL (*)    Ketones, ur 15 (*)    Leukocytes, UA MODERATE (*)    All other components within normal limits  URINE MICROSCOPIC-ADD ON      Patient will be treated for STD and epididymitis.  Told to return here as needed.  Also check for RPR, do to the rash the patient has.  Carlyle Dollyhristopher W Terese Heier, PA-C 08/25/14 1452

## 2014-08-26 LAB — GC/CHLAMYDIA PROBE AMP
CT Probe RNA: NEGATIVE
GC Probe RNA: NEGATIVE

## 2014-12-02 ENCOUNTER — Emergency Department (HOSPITAL_COMMUNITY)
Admission: EM | Admit: 2014-12-02 | Discharge: 2014-12-02 | Discharge status: Against Medical Advice | Payer: Medicare Other | Attending: Emergency Medicine | Admitting: Emergency Medicine

## 2014-12-02 ENCOUNTER — Encounter (HOSPITAL_COMMUNITY): Payer: Self-pay | Admitting: Emergency Medicine

## 2014-12-02 DIAGNOSIS — L03012 Cellulitis of left finger: Secondary | ICD-10-CM | POA: Diagnosis not present

## 2014-12-02 DIAGNOSIS — Z791 Long term (current) use of non-steroidal anti-inflammatories (NSAID): Secondary | ICD-10-CM | POA: Insufficient documentation

## 2014-12-02 DIAGNOSIS — M79645 Pain in left finger(s): Secondary | ICD-10-CM | POA: Diagnosis present

## 2014-12-02 DIAGNOSIS — Z720 Tobacco use: Secondary | ICD-10-CM | POA: Insufficient documentation

## 2014-12-02 DIAGNOSIS — J45909 Unspecified asthma, uncomplicated: Secondary | ICD-10-CM | POA: Diagnosis not present

## 2014-12-02 DIAGNOSIS — I10 Essential (primary) hypertension: Secondary | ICD-10-CM | POA: Diagnosis not present

## 2014-12-02 MED ORDER — LIDOCAINE HCL 1 % IJ SOLN
20.0000 mL | Freq: Once | INTRAMUSCULAR | Status: DC
  Filled 2014-12-02: qty 20

## 2014-12-02 NOTE — ED Notes (Signed)
Pt c/o left thumb pain x 2 days

## 2014-12-02 NOTE — ED Provider Notes (Signed)
CSN: [id number]     Arrival date & time 12/02/14  1759 History  This chart was scribed for non-physician practitioner working with Stuart Sou, MD by Richarda Overlie, ED Scribe. This patient was seen in room TR10C/TR10C and the patient's care was started at 7:02 PM.  Chief Complaint  Patient presents with  . Hand Pain   The history is provided by the patient. No language interpreter was used.   HPI Comments: Stuart Bell is a 54 y.o. male with a history of HTN and Asthma who presents to the Emergency Department complaining of moderate left thumb pain for the last 2 days. He rates his pain as a 9 of 10 at this time. Pt states he has never experienced any similar previous episodes. He states that he does not bite his nails. Pt reports no pertinent past medical history. He reports no modifying or alleviating factors currently.   Past Medical History  Diagnosis Date  . Hypertension   . Asthma    History reviewed. No pertinent past surgical history. History reviewed. No pertinent family history. History  Substance Use Topics  . Smoking status: Light Tobacco Smoker  . Smokeless tobacco: Not on file  . Alcohol Use: 0.0 oz/week    Review of Systems  A complete 10 system review of systems was obtained and all systems are negative except as noted in the HPI and PMH.     Allergies  Review of patient's allergies indicates no known allergies.  Home Medications   Prior to Admission medications   Medication Sig Start Date End Date Taking? Authorizing Provider  acetaminophen (TYLENOL) 500 MG tablet Take 1,000 mg by mouth every 6 (six) hours as needed. For pain.    Historical Provider, MD  doxycycline (VIBRAMYCIN) 100 MG capsule Take 1 capsule (100 mg total) by mouth 2 (two) times daily. 08/25/14   Jamesetta Orleans Lawyer, PA-C  ibuprofen (ADVIL,MOTRIN) 800 MG tablet Take 1 tablet (800 mg total) by mouth every 8 (eight) hours as needed. 08/25/14   Jamesetta Orleans Lawyer, PA-C   BP 170/84 mmHg   Pulse 90  Temp(Src) 98.2 F (36.8 C)  Resp 16  SpO2 98% Physical Exam  Constitutional: He is oriented to person, place, and time. He appears well-developed and well-nourished.  HENT:  Head: Normocephalic and atraumatic.  Eyes: Right eye exhibits no discharge. Left eye exhibits no discharge.  Neck: Neck supple. No tracheal deviation present.  Cardiovascular: Normal rate.   Pulmonary/Chest: Effort normal. No respiratory distress.  Abdominal: He exhibits no distension.  Neurological: He is alert and oriented to person, place, and time.  Skin: Skin is warm and dry.  Thumb paronychia, no active drainage, no associated felon  Psychiatric: He has a normal mood and affect. His behavior is normal.  Nursing note and vitals reviewed.   ED Course  Procedures   DIAGNOSTIC STUDIES: Oxygen Saturation is 98% on RA, normal by my interpretation.    COORDINATION OF CARE: 7:04 PM Discussed treatment plan with pt at bedside and pt agreed to plan.   Labs Review Labs Reviewed - No data to display  Imaging Review No results found.   EKG Interpretation None      MDM   Final diagnoses:  Paronychia, left   Filed Vitals:   12/02/14 1806  BP: 170/84  Pulse: 90  Temp: 98.2 F (36.8 C)  Resp: 16  SpO2: 98%    Stuart Bell is a pleasant 54 y.o. male presenting with left thumb paronychia. All gathering equipment  to perform digital block and incision and drainage I was informed by the nurse that patient has left the ED,    I personally performed the services described in this documentation, which was scribed in my presence. The recorded information has been reviewed and is accurate.      Wynetta Emeryicole Darral Rishel, PA-C 12/03/14 0225  Stuart SouSam Jacubowitz, MD 12/05/14 [id number]

## 2015-01-27 ENCOUNTER — Emergency Department (HOSPITAL_COMMUNITY)
Admission: EM | Admit: 2015-01-27 | Discharge: 2015-01-28 | Disposition: A | Discharge status: Home / Self Care | Payer: Medicare Other | Attending: Emergency Medicine | Admitting: Emergency Medicine

## 2015-01-27 ENCOUNTER — Encounter (HOSPITAL_COMMUNITY): Payer: Self-pay

## 2015-01-27 DIAGNOSIS — F1010 Alcohol abuse, uncomplicated: Secondary | ICD-10-CM | POA: Insufficient documentation

## 2015-01-27 DIAGNOSIS — J45909 Unspecified asthma, uncomplicated: Secondary | ICD-10-CM | POA: Diagnosis not present

## 2015-01-27 DIAGNOSIS — I10 Essential (primary) hypertension: Secondary | ICD-10-CM | POA: Insufficient documentation

## 2015-01-27 DIAGNOSIS — Z720 Tobacco use: Secondary | ICD-10-CM | POA: Diagnosis not present

## 2015-01-27 DIAGNOSIS — F1410 Cocaine abuse, uncomplicated: Secondary | ICD-10-CM | POA: Insufficient documentation

## 2015-01-27 DIAGNOSIS — Z792 Long term (current) use of antibiotics: Secondary | ICD-10-CM | POA: Insufficient documentation

## 2015-01-27 DIAGNOSIS — F10929 Alcohol use, unspecified with intoxication, unspecified: Secondary | ICD-10-CM

## 2015-01-27 HISTORY — DX: Other psychoactive substance abuse, uncomplicated: F19.10

## 2015-01-27 HISTORY — DX: Bipolar disorder, unspecified: F31.9

## 2015-01-27 LAB — CBC WITH DIFFERENTIAL/PLATELET
BASOS ABS: 0 10*3/uL (ref 0.0–0.1)
Basophils Relative: 1 % (ref 0–1)
Eosinophils Absolute: 0.1 10*3/uL (ref 0.0–0.7)
Eosinophils Relative: 2 % (ref 0–5)
HEMATOCRIT: 44.5 % (ref 39.0–52.0)
HEMOGLOBIN: 15.2 g/dL (ref 13.0–17.0)
LYMPHS ABS: 1.9 10*3/uL (ref 0.7–4.0)
LYMPHS PCT: 45 % (ref 12–46)
MCH: 30.7 pg (ref 26.0–34.0)
MCHC: 34.2 g/dL (ref 30.0–36.0)
MCV: 89.9 fL (ref 78.0–100.0)
MONO ABS: 0.2 10*3/uL (ref 0.1–1.0)
Monocytes Relative: 6 % (ref 3–12)
NEUTROS ABS: 1.9 10*3/uL (ref 1.7–7.7)
Neutrophils Relative %: 46 % (ref 43–77)
Platelets: 200 10*3/uL (ref 150–400)
RBC: 4.95 MIL/uL (ref 4.22–5.81)
RDW: 13.3 % (ref 11.5–15.5)
WBC: 4.2 10*3/uL (ref 4.0–10.5)

## 2015-01-27 LAB — COMPREHENSIVE METABOLIC PANEL
ALBUMIN: 3.8 g/dL (ref 3.5–5.2)
ALK PHOS: 53 U/L (ref 39–117)
ALT: 26 U/L (ref 0–53)
ANION GAP: 10 (ref 5–15)
AST: 28 U/L (ref 0–37)
BUN: 8 mg/dL (ref 6–23)
CALCIUM: 8.8 mg/dL (ref 8.4–10.5)
CO2: 24 mmol/L (ref 19–32)
CREATININE: 1 mg/dL (ref 0.50–1.35)
Chloride: 105 mmol/L (ref 96–112)
GFR calc Af Amer: >90 mL/min (ref >90)
GFR, EST NON AFRICAN AMERICAN: 84 mL/min — AB (ref >90)
GLUCOSE: 94 mg/dL (ref 70–99)
POTASSIUM: 4.2 mmol/L (ref 3.5–5.1)
Sodium: 139 mmol/L (ref 135–145)
Total Bilirubin: 0.5 mg/dL (ref 0.3–1.2)
Total Protein: 7.1 g/dL (ref 6.0–8.3)

## 2015-01-27 LAB — SALICYLATE LEVEL: Salicylate Lvl: <4 mg/dL (ref 2.8–20.0)

## 2015-01-27 LAB — ACETAMINOPHEN LEVEL

## 2015-01-27 LAB — ETHANOL: ALCOHOL ETHYL (B): 263 mg/dL — AB (ref 0–9)
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
## 2015-01-28 ENCOUNTER — Encounter (HOSPITAL_COMMUNITY): Payer: Self-pay | Admitting: Emergency Medicine

## 2015-01-28 LAB — RAPID URINE DRUG SCREEN, HOSP PERFORMED
Amphetamines: NOT DETECTED
BENZODIAZEPINES: NOT DETECTED
Barbiturates: NOT DETECTED
Cocaine: POSITIVE — AB
Opiates: NOT DETECTED
Tetrahydrocannabinol: NOT DETECTED

## 2015-01-28 NOTE — ED Notes (Signed)
MD aware to come reevaluate pt.

## 2015-01-28 NOTE — Discharge Instructions (Signed)
Please follow directions provided. Use the resource guide to establish care with a primary care doctor to ensure you're getting better. Please drink plenty of nonalcoholic fluids to stay well hydrated. Be sure to eat a balanced diet. Don't hesitate to return for any new, worsening, or concerning symptoms.   SEEK IMMEDIATE MEDICAL CARE IF:  You become shaky or tremble when you try to stop drinking.  You shake uncontrollably (seizure).  You throw up (vomit) blood. This may be bright red or may look like black coffee grounds.  You have blood in your stool. This may be bright red or may appear as a black, tarry, bad smelling stool.  You become lightheaded or faint.     Emergency Department Resource Guide 1) Find a Doctor and Pay Out of Pocket Although you won't have to find out who is covered by your insurance plan, it is a good idea to ask around and get recommendations. You will then need to call the office and see if the doctor you have chosen will accept you as a new patient and what types of options they offer for patients who are self-pay. Some doctors offer discounts or will set up payment plans for their patients who do not have insurance, but you will need to ask so you aren't surprised when you get to your appointment.  2) Contact Your Local Health Department Not all health departments have doctors that can see patients for sick visits, but many do, so it is worth a call to see if yours does. If you don't know where your local health department is, you can check in your phone book. The CDC also has a tool to help you locate your state's health department, and many state websites also have listings of all of their local health departments.  3) Find a Walk-in Clinic If your illness is not likely to be very severe or complicated, you may want to try a walk in clinic. These are popping up all over the country in pharmacies, drugstores, and shopping centers. They're usually staffed by nurse  practitioners or physician assistants that have been trained to treat common illnesses and complaints. They're usually fairly quick and inexpensive. However, if you have serious medical issues or chronic medical problems, these are probably not your best option.  No Primary Care Doctor: - Call Health Connect at  [phone] - they can help you locate a primary care doctor that  accepts your insurance, provides certain services, etc. - Physician Referral Service- [phone]  Chronic Pain Problems: Organization         Address  Phone   Notes  [facility]  [phone] Patients need to be referred by their primary care doctor.   Medication Assistance: Organization         Address  Phone   Notes  [facility] Medication [facility] [address] [place], [place] [address] [phone] --Must be a resident of [facility] -- Must have NO insurance coverage whatsoever (no Medicaid/ Medicare, etc.) -- The pt. MUST have a primary care doctor that directs their care regularly and follows them in the community   MedAssist  [phone]   [employer]  [phone]    Agencies that provide inexpensive medical care: Organization         Address  Phone   Notes  [facility]  [phone]   [facility]    [phone]  [facility] [address] [place], [place] [address] [phone]   [facility] [place] [address], [place] [phone]) [phone]   Planned Parenthood    [phone]   [facility]    [phone]   [facility]  [address], [place] Phone:  [phone]) [phone], Fax:  [phone]) [phone] Hours of Operation:  9 am - 6 pm, M-F.  Also accepts Medicaid/Medicare and self-pay.  [facility]  [address], [place] Phone: [phone], Fax: [phone]. Hours of Operation:  8:30 am -  5:30 pm, M-F.  Also accepts Medicaid and self-pay.  [facility] [place] [address], [place] Phone: [phone](336) [phone]   [facility] [address], [place], [place] [phone], Ext. [phone] Mondays & Thursdays: 7-9 AM.  First 15 patients are seen on a first come, first serve basis.    Medicaid-accepting [facility] Providers:  Organization         Address  Phone   Notes  [facility] [address], [place] [phone] Also accepts self-pay patients.  [facility] [address] [place], [place]  [phone]) [phone]   [facility] [address], [place] [phone]) [phone]   [facility] [address] [place] [address], [place] [phone]) [phone]   Renaye RakersVeita Bland [address], [place]   [phone] Only accepts [place] Access [place] patients after they have their name applied to their card.   Self-Pay (no insurance) in [facility] [place]:  Organization         Address  Phone   Notes  Sickle Cell Patients, [facility] [address], [place] [phone]) [phone]   [facility] [address], [place] [phone]) [phone]   [facility] [place]  [address], [place] [phone]) [phone]   [facility]/Dr. Osei-Bonsu  [address] [place] [address], [place] or [phone], [place] [phone]) [phone] Phone number for both [place] and [place] locations is the same.  [facility] [address], [place] [phone]) [phone]   [facility] [place] [address] [place] [address], [place] or [address] [phone]   [facility] [address], [place] [phone], phone; [phone]) [phone], fax Sees patients 1st and 3rd Saturday of every month.  Must not qualify for public or private insurance (i.e. Medicaid, Medicare, [place] Health Choice,  Veterans' Benefits)  Household income should be no more than 200% of the poverty level The clinic cannot treat you if you are pregnant or think you are pregnant  Sexually transmitted diseases are not treated at the clinic.    Dental Care: Organization         Address  Phone  Notes  [facility] [address], [place] [phone] Accepts children up to age 54 who are enrolled in [place]icaid or [place] Health Choice; pregnant women with a Medicaid card; and children who have applied for Medicaid or [place] Health Choice, but were declined, whose parents can pay a reduced fee at time of service.  [facility] of [facility] [place]  [address], [place] [phone]) [phone] Accepts children up to age 54 who  are enrolled in Medicaid or [place] Health Choice; pregnant women with a Medicaid card; and children who have applied for Medicaid or [place] Health Choice, but were declined, whose parents can pay a reduced fee at time of service.  [facility]  [address], [place] [phone]) [phone] Patients are seen by appointment only. Walk-ins are not accepted. [facility] will see patients 54 years of age and older. Monday - Tuesday (8am-5pm) Most Wednesdays (8:30-5pm) $30 per visit, cash only  [facility]  [address], [place] [phone] Patients are seen by appointment only. Walk-ins are not accepted. [facility] will see patients 54 years of age and older. One Wednesday Evening (Monthly: Volunteer Based).  $30 per visit, cash only  [employer]  [phone] for adults; Children under age 464, call Graduate Pediatric Dentistry at [phone]. Children aged 94-14, please call [phone] to request a pediatric application.  Dental services are provided in all areas of dental care including fillings, crowns and bridges, complete and  partial dentures, implants, gum treatment, root canals, and extractions. Preventive care is also provided. Treatment is provided to both adults and children. Patients are selected via a lottery and there is often a waiting list.   [facility] [address], [place]  [phone]) [phone] [email or web address]   [facility] [address], [place], [place] [phone], Ext. [phone] Second and Fourth Thursday of each month, opens at 6:30 AM; Clinic ends at 9 AM.  Patients are seen on a first-come first-served basis, and a limited number are seen during each clinic.   [facility]  [address], [place], [place] [phone]) [phone]   Eligibility Requirements You must have lived in [place], [place], or [place] counties for at least the last three months.   You cannot be eligible for state or federal sponsored National Cityhealthcare insurance, including CIGNAVeterans Administration, [place]icaid, or [employer] EntertainmentMedicare.   You generally cannot be eligible for healthcare insurance through your employer.    How to apply: Eligibility screenings are held every Tuesday and Wednesday afternoon from 1:00 pm until 4:00 pm. You do not need an appointment for the interview!  [facility] [address], [place], [place] [phone]   [facility]  [phone]   [facility]  [phone]   [facility]  [phone]    Behavioral Health Resources in the Community: Intensive Outpatient Programs Organization         Address  Phone  Notes  [facility] [address], [place], [place] [phone]   [facility] [address], [place], [place] [phone]   [facility] [address], [place], [place]  [phone]   [facility] [address],  [place], [place] [phone] or [phone]   Substance Abuse Resources Organization          Address  Phone  Notes  [facility]  [phone]   [facility]  [phone]   [facility]  [phone]   [facility]  [phone]   Residential & Outpatient Substance Abuse Program  [phone]   Psychological Services Organization         Address  Phone  Notes  [facility]  [phone]- [phone]   [facility]  [phone]- [phone]   [facility] [address],  [place] [phone] or [phone]    Mobile Crisis Teams Organization         Address  Phone  Notes  [facility], Mobile Crisis Care Unit  [phone]   [facility]  [address] [place], [place]   [place] Levels [address], [place] [phone]    Self-Help/Support Groups Organization         Address  Phone             Notes  [place]. of [place] - variety of support groups  [place] Call for more information  Narcotics Anonymous (NA), [facility] [address], [employer] [place]  2 meetings at this location   Special educational needs teacher         Address  Phone  Notes  [facility] [place],    [employer]  [phone]   [facility]  [address], [place] [address]2683, [place], [place]   Ricardo [place], [place] [phone] Admissions: 8am-3pm M-F  Incentives Substance [place] [address],    [place], [place] [phone]   [facility] [address], [place], [place]   [facility] [address],  [place], [place] Choice   Insight Programs - Intensive Outpatient [address], [place], [place]   [facility] ([place].) [address].,  [place], [place] [phone] or [phone]   [facility] (RTS) [address] [place], [place] Accepts Medicaid  [facility] Rodri­guez Hevia [address]  [place] [phone] Substance Abuse/Addiction Treatment   [facility] Organization         Address  Phone  Notes  [facility]  [phone]   Domenic Schwab, PhD [address]lis Port[address] [place], [place]   [phone] or [phone]   [place] [address] [place], [place] [phone]   [facility] [address], [place], [place] [phone] Insurance/Medicaid/sponsorship through [facility] and Families [address]                                    [place], [place] [phone] [address] [address], [place] [phone]    Dr. Adele Schilder  [phone]   [facility]. 1) [address] [place] 2) [place] 3)  [address], [place] [phone]  [phone]   [place] [phone] or [phone] (After Hours)

## 2015-01-28 NOTE — ED Notes (Signed)
PA at bedside.

## 2015-01-28 NOTE — ED Provider Notes (Signed)
Patient care received from Sharilyn SitesLisa Sanders, PA-C, at end of shift with plan to allow patient to sober.  He has been sleeping without waking, VS normal, breathing even/unlabored.   Patient care will be given to Donetta PottsBeth Tysinger, NP, at end of shift to wake in the morning, assess for stability for discharge.   Elpidio AnisShari Chany Woolworth, PA-C 01/28/15 [phone]  Raeford RazorStephen Kohut, MD 02/01/15 [phone]

## 2015-01-28 NOTE — ED Notes (Addendum)
Pt ambulated to restroom with steady gait and without distress.

## 2015-01-28 NOTE — ED Notes (Signed)
Pt updated on POC; resting; plans to eat breakfast shortly.

## 2015-01-28 NOTE — ED Notes (Signed)
PT ambulated with baseline gait; VSS; A&Ox3; no signs of distress; respirations even and unlabored; skin warm and dry; no questions upon discharge.  

## 2015-01-28 NOTE — ED Provider Notes (Signed)
6:05 AM: At change of shift, hand-off report received from Elpidio AnisShari Upstill, PA-C. Pt currently sleeping without distress, will re-eval when can ambulate and carry on conversation.  7:45 AM: Pt alert and oriented, sitting up eating breakfast and reports readiness to go home.  Pt ambulated without difficulty and is ready for discharge. Pt is well-appearing, in no acute distress and vital signs reviewed and not concerning. He appears safe to be discharged.  Return precautions provided. Pt aware of plan and in agreement.   Filed Vitals:   01/27/15 2230 01/27/15 2315 01/28/15 0549 01/28/15 0755  BP: 115/78 100/56 124/69 143/88  Pulse: 88 80 82 90  Temp:   98.3 F (36.8 C) 98.2 F (36.8 C)  TempSrc:   Oral Oral  Resp: 21 16 16 16   Height:      Weight:      SpO2: 94% 93% 100% 99%   Meds given in ED:  Medications - No data to display  Discharge Medication List as of 01/28/2015  7:50 AM       Harle BattiestElizabeth Cullin Dishman, NP 01/28/15 [id number]  Toy CookeyMegan Docherty, MD 01/29/15 [phone]

## 2015-05-04 ENCOUNTER — Emergency Department (HOSPITAL_COMMUNITY): Payer: Medicare Other

## 2015-05-04 ENCOUNTER — Encounter (HOSPITAL_COMMUNITY): Payer: Self-pay

## 2015-05-04 ENCOUNTER — Emergency Department (HOSPITAL_COMMUNITY)
Admission: EM | Admit: 2015-05-04 | Discharge: 2015-05-04 | Disposition: A | Discharge status: Home / Self Care | Payer: Medicare Other | Attending: Emergency Medicine | Admitting: Emergency Medicine

## 2015-05-04 DIAGNOSIS — S199XXA Unspecified injury of neck, initial encounter: Secondary | ICD-10-CM | POA: Diagnosis present

## 2015-05-04 DIAGNOSIS — Z8659 Personal history of other mental and behavioral disorders: Secondary | ICD-10-CM | POA: Insufficient documentation

## 2015-05-04 DIAGNOSIS — Z792 Long term (current) use of antibiotics: Secondary | ICD-10-CM | POA: Insufficient documentation

## 2015-05-04 DIAGNOSIS — R4182 Altered mental status, unspecified: Secondary | ICD-10-CM | POA: Diagnosis not present

## 2015-05-04 DIAGNOSIS — J45909 Unspecified asthma, uncomplicated: Secondary | ICD-10-CM | POA: Diagnosis not present

## 2015-05-04 DIAGNOSIS — Y929 Unspecified place or not applicable: Secondary | ICD-10-CM | POA: Diagnosis not present

## 2015-05-04 DIAGNOSIS — Y999 Unspecified external cause status: Secondary | ICD-10-CM | POA: Insufficient documentation

## 2015-05-04 DIAGNOSIS — I10 Essential (primary) hypertension: Secondary | ICD-10-CM | POA: Diagnosis not present

## 2015-05-04 DIAGNOSIS — S299XXA Unspecified injury of thorax, initial encounter: Secondary | ICD-10-CM | POA: Insufficient documentation

## 2015-05-04 DIAGNOSIS — S4991XA Unspecified injury of right shoulder and upper arm, initial encounter: Secondary | ICD-10-CM | POA: Insufficient documentation

## 2015-05-04 DIAGNOSIS — Z720 Tobacco use: Secondary | ICD-10-CM | POA: Insufficient documentation

## 2015-05-04 DIAGNOSIS — Y939 Activity, unspecified: Secondary | ICD-10-CM | POA: Diagnosis not present
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
## 2015-05-04 NOTE — ED Notes (Signed)
Pt refusing to have any blood draw or IV started at this time, Dr. Mora Bellman notified.
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
## 2015-05-04 NOTE — ED Notes (Signed)
PT ambulated with baseline gait; VSS; A&Ox3; no signs of distress; respirations even and unlabored; skin warm and dry; no questions upon discharge.  

## 2015-07-26 ENCOUNTER — Emergency Department (HOSPITAL_COMMUNITY)
Admission: EM | Admit: 2015-07-26 | Discharge: 2015-07-26 | Disposition: A | Discharge status: Home / Self Care | Payer: Medicare Other | Attending: Emergency Medicine | Admitting: Emergency Medicine

## 2015-07-26 ENCOUNTER — Encounter (HOSPITAL_COMMUNITY): Payer: Self-pay | Admitting: Emergency Medicine

## 2015-07-26 DIAGNOSIS — Z720 Tobacco use: Secondary | ICD-10-CM | POA: Insufficient documentation

## 2015-07-26 DIAGNOSIS — M542 Cervicalgia: Secondary | ICD-10-CM | POA: Diagnosis present

## 2015-07-26 DIAGNOSIS — I10 Essential (primary) hypertension: Secondary | ICD-10-CM | POA: Diagnosis not present

## 2015-07-26 DIAGNOSIS — M62838 Other muscle spasm: Secondary | ICD-10-CM | POA: Diagnosis not present

## 2015-07-26 DIAGNOSIS — J45909 Unspecified asthma, uncomplicated: Secondary | ICD-10-CM | POA: Insufficient documentation

## 2015-07-26 DIAGNOSIS — Z8659 Personal history of other mental and behavioral disorders: Secondary | ICD-10-CM | POA: Diagnosis not present

## 2015-07-26 MED ORDER — IBUPROFEN 600 MG PO TABS: 600.0000 mg | ORAL_TABLET | Freq: Four times a day (QID) | ORAL | Status: DC | PRN

## 2015-07-26 MED ORDER — CYCLOBENZAPRINE HCL 10 MG PO TABS: 10.0000 mg | ORAL_TABLET | Freq: Two times a day (BID) | ORAL | Status: DC | PRN

## 2015-07-26 NOTE — Discharge Instructions (Signed)
Muscle Cramps and Spasms Muscle cramps and spasms are when muscles tighten by themselves. They usually get better within minutes. Muscle cramps are painful. They are usually stronger and last longer than muscle spasms. Muscle spasms may or may not be painful. They can last a few seconds or much longer. HOME CARE  Drink enough fluid to keep your pee (urine) clear or pale yellow.  Massage, stretch, and relax the muscle.  Use a warm towel, heating pad, or warm shower water on tight muscles.  Place ice on the muscle if it is tender or in pain.  Put ice in a plastic bag.  Place a towel between your skin and the bag.  Leave the ice on for 15-20 minutes, 03-04 times a day.  Only take medicine as told by your doctor. GET HELP RIGHT AWAY IF:  Your cramps or spasms get worse, happen more often, or do not get better with time. MAKE SURE YOU:  Understand these instructions.  Will watch your condition.  Will get help right away if you are not doing well or get worse. Document Released: 10/19/2008 Document Revised: 03/03/2013 Document Reviewed: 10/23/2012 Exit[employer] Patient Information 2015 [employer]. This information is not intended to replace advice given to you by your health care provider. Make sure you discuss any questions you have with your health care provider.  

## 2015-07-26 NOTE — ED Provider Notes (Signed)
CSN: [id number]     Arrival date & time 07/26/15  2130 History   First MD Initiated Contact with Patient 07/26/15 0840     Chief Complaint  Patient presents with  . Neck Pain  . Shoulder Pain     (Consider location/radiation/quality/duration/timing/severity/associated sxs/prior Treatment) HPI Comments: Patient presents to the emergency department with chief complaint of right-sided neck pain. He states that when he awoke on Saturday morning the right side of his neck was sore.  States that he might have slept on it wrong. He states that the pain is worsened when he moves his neck. It is also worsened with palpation of the right sided upper trapezius. Patient denies any fevers, chills, nausea, or vomiting. He states that he took some of his friend's pain medication with minimal relief. He denies any chest pain or shortness breath.   The history is provided by the patient. No language interpreter was used.    Past Medical History  Diagnosis Date  . Hypertension   . Asthma   . Bipolar 1 disorder 2015  . Substance abuse    History reviewed. No pertinent past surgical history. No family history on file. Social History  Substance Use Topics  . Smoking status: Light Tobacco Smoker  . Smokeless tobacco: None  . Alcohol Use: 0.0 oz/week    Review of Systems  Constitutional: Negative for fever and chills.  Respiratory: Negative for shortness of breath.   Cardiovascular: Negative for chest pain.  Gastrointestinal: Negative for nausea, vomiting, diarrhea and constipation.  Genitourinary: Negative for dysuria.  Musculoskeletal: Positive for myalgias and neck pain.      Allergies  Review of patient's allergies indicates no known allergies.  Home Medications   Prior to Admission medications   Medication Sig Start Date End Date Taking? Authorizing Provider  acetaminophen (TYLENOL) 500 MG tablet Take 1,000 mg by mouth every 6 (six) hours as needed. For pain.    Historical Provider, MD    BP 136/86 mmHg  Pulse 92  Temp(Src) 98.4 F (36.9 C) (Oral)  Resp 18  Ht 6' (1.829 m)  Wt 160 lb (72.576 kg)  BMI 21.70 kg/m2  SpO2 100% Physical Exam  Constitutional: He is oriented to person, place, and time. He appears well-developed and well-nourished.  HENT:  Head: Normocephalic and atraumatic.  Eyes: Conjunctivae and EOM are normal.  Neck: Normal range of motion.  Cardiovascular: Normal rate.   Pulmonary/Chest: Effort normal.  Abdominal: He exhibits no distension.  Musculoskeletal: Normal range of motion.  Right-sided upper trapezius tender to palpation, no bony abnormality or deformity of the cervical spine, range of motion and strength of neck is mildly limited secondary to pain  Neurological: He is alert and oriented to person, place, and time.  Skin: Skin is dry. No rash noted.  Skin is intact, no rashes or sign of infection  Psychiatric: He has a normal mood and affect. His behavior is normal. Judgment and thought content normal.  Nursing note and vitals reviewed.   ED Course  Procedures (including critical care time)   MDM   Final diagnoses:  Muscle spasms of neck    Patient with right-sided neck pain. Suspect muscle spasm. No other abnormalities or concerning signs or symptoms on exam. Will treat with Flexeril and ibuprofen. Recommend gentle stretching and warm compresses.    Roxy Horseman, PA-C 07/26/15 [id number]  Tilden Fossa, MD 07/26/15 1024

## 2015-07-26 NOTE — ED Notes (Signed)
Patient is alert and orientedx4.  Patient was explained discharge instructions and they understood them with no questions.   

## 2015-07-26 NOTE — ED Notes (Signed)
Pt woke up with right sided neck and shoulder pain onset Saturday.

## 2015-09-03 DIAGNOSIS — I10 Essential (primary) hypertension: Secondary | ICD-10-CM

## 2015-09-03 NOTE — Congregational Nurse Program (Signed)
Initial visit to the shelter clinic.  States is out of "blood pressure" medication.  States he missed his appointment at [facility].  [facility] if it was going to be several months before he can re-schedule with [facility].  Phone and directions given.  Client agreed to follow through

## 2015-10-21 DIAGNOSIS — I10 Essential (primary) hypertension: Secondary | ICD-10-CM

## 2015-11-01 NOTE — Congregational Nurse Program (Signed)
Congregational Nurse Program Note  Date of Encounter: 10/21/2015  Past Medical History: Past Medical History  Diagnosis Date  . Hypertension   . Asthma   . Bipolar 1 disorder 2015  . Substance abuse     Encounter Details:     CNP Questionnaire - 11/01/15 1330    Patient Demographics   Is this a new or existing patient? Existing   Patient is considered a/an Not Applicable   Patient Assistance   Patient's financial/insurance status Medicaid;Low Income   Patient referred to apply for the following financial assistance Not Applicable   Food insecurities addressed Provided food supplies   Transportation assistance Yes   Assistance securing medications Yes   Educational health offerings Not Applicable   Encounter Details   Primary purpose of visit Navigating the Healthcare System;Chronic Illness/Condition Visit   Was an Emergency Department visit averted? Not Applicable   Does patient have a medical provider? Yes   Patient referred to Not Applicable   Was a mental health screening completed? (GAINS tool) No   Does patient have dental issues? No   Since previous encounter, have you referred patient for abnormal blood pressure that resulted in a new diagnosis or medication change? No   Since previous encounter, have you referred patient for abnormal blood glucose that resulted in a new diagnosis or medication change? No   For Abstraction Use Only   Does patient have insurance? Yes       Clinic visit.  B/P 140/90.  States plans on picking up meds tomorrow.  Encouraged him to RTC in one week to f/u with B/P

## 2015-11-11 DIAGNOSIS — I10 Essential (primary) hypertension: Secondary | ICD-10-CM

## 2015-11-16 DIAGNOSIS — I10 Essential (primary) hypertension: Secondary | ICD-10-CM

## 2015-11-16 NOTE — Congregational Nurse Program (Signed)
Congregational Nurse Program Note  Date of Encounter: 11/11/2015  Past Medical History: Past Medical History  Diagnosis Date  . Hypertension   . Asthma   . Bipolar 1 disorder 2015  . Substance abuse     Encounter Details:     CNP Questionnaire - 11/16/15 1455    Patient Demographics   Is this a new or existing patient? Existing   Patient is considered a/an Not Applicable   Patient Assistance   Patient's financial/insurance status Medicaid;Low Income   Patient referred to apply for the following financial assistance Not Applicable   Food insecurities addressed Provided food supplies   Transportation assistance No   Assistance securing medications No   Educational health offerings Chronic disease;Navigating the healthcare system   Encounter Details   Primary purpose of visit Chronic Illness/Condition Visit;Education/Health Concerns   Was an Emergency Department visit averted? Not Applicable   Does patient have a medical provider? Yes   Patient referred to Not Applicable   Was a mental health screening completed? (GAINS tool) No   Does patient have dental issues? No   Since previous encounter, have you referred patient for abnormal blood pressure that resulted in a new diagnosis or medication change? No   Since previous encounter, have you referred patient for abnormal blood glucose that resulted in a new diagnosis or medication change? No   For Abstraction Use Only   Does patient have insurance? Yes     clinic visit:  Requested B/P check.  States needs to pick up meds at the pharmacy as he is out of B/P meds.  Determined he had transportation and co-pay to obtain medications.

## 2015-11-18 DIAGNOSIS — I10 Essential (primary) hypertension: Secondary | ICD-10-CM

## 2015-11-28 NOTE — Congregational Nurse Program (Signed)
Congregational Nurse Program Note  Date of Encounter: 11/16/2015  Past Medical History: Past Medical History  Diagnosis Date  . Hypertension   . Asthma   . Bipolar 1 disorder 2015  . Substance abuse     Encounter Details:     CNP Questionnaire - 11/16/15 1610    Patient Demographics   Is this a new or existing patient? Existing   Patient is considered a/an Not Applicable   Race African-American/Black   Patient Assistance   Location of Patient Assistance Not Applicable   Patient's financial/insurance status Medicaid;Low Income   Uninsured Patient No   Patient referred to apply for the following financial assistance Not Applicable   Food insecurities addressed Provided food supplies   Transportation assistance No   Assistance securing medications No   Educational health offerings Chronic disease;Navigating the healthcare system   Encounter Details   Primary purpose of visit Chronic Illness/Condition Visit;Education/Health Concerns   Was an Emergency Department visit averted? Not Applicable   Does patient have a medical provider? Yes   Patient referred to Not Applicable   Was a mental health screening completed? (GAINS tool) No   Does patient have dental issues? No   Since previous encounter, have you referred patient for abnormal blood pressure that resulted in a new diagnosis or medication change? No   Since previous encounter, have you referred patient for abnormal blood glucose that resulted in a new diagnosis or medication change? No   For Abstraction Use Only   Does patient have insurance? Yes        clinic visit.  B/P check

## 2015-11-28 NOTE — Congregational Nurse Program (Signed)
Congregational Nurse Program Note  Date of Encounter: 11/18/2015  Past Medical History: Past Medical History  Diagnosis Date  . Hypertension   . Asthma   . Bipolar 1 disorder 2015  . Substance abuse     Encounter Details:     CNP Questionnaire - 11/18/15 1613    Patient Demographics   Is this a new or existing patient? Existing   Patient is considered a/an Not Applicable   Race African-American/Black   Patient Assistance   Location of Patient Assistance Not Applicable   Patient's financial/insurance status Medicaid;Low Income   Uninsured Patient No   Patient referred to apply for the following financial assistance Not Applicable   Food insecurities addressed Provided food supplies   Transportation assistance No   Assistance securing medications No   Educational health offerings Chronic disease;Navigating the healthcare system   Encounter Details   Primary purpose of visit Chronic Illness/Condition Visit;Education/Health Concerns   Was an Emergency Department visit averted? Not Applicable   Does patient have a medical provider? Yes   Patient referred to Not Applicable   Was a mental health screening completed? (GAINS tool) No   Does patient have dental issues? No   Since previous encounter, have you referred patient for abnormal blood pressure that resulted in a new diagnosis or medication change? No   Since previous encounter, have you referred patient for abnormal blood glucose that resulted in a new diagnosis or medication change? No   For Abstraction Use Only   Does patient have insurance? Yes       Clinic note:  B/P check.  [facility]

## 2015-11-30 DIAGNOSIS — I10 Essential (primary) hypertension: Secondary | ICD-10-CM

## 2015-11-30 NOTE — Congregational Nurse Program (Signed)
Congregational Nurse Program Note  Date of Encounter: 11/30/2015  Past Medical History: Past Medical History  Diagnosis Date  . Hypertension   . Asthma   . Bipolar 1 disorder 2015  . Substance abuse     Encounter Details:     CNP Questionnaire - 11/30/15 1531    Patient Demographics   Is this a new or existing patient? Existing   Patient is considered a/an Not Applicable   Race African-American/Black   Patient Assistance   Location of Patient Assistance Not Applicable   Patient's financial/insurance status Medicaid;Low Income   Uninsured Patient No   Patient referred to apply for the following financial assistance Not Applicable   Food insecurities addressed Provided food supplies   Transportation assistance No   Assistance securing medications No   Educational health offerings Chronic disease;Navigating the healthcare system   Encounter Details   Primary purpose of visit Chronic Illness/Condition Visit;Education/Health Concerns   Was an Emergency Department visit averted? Not Applicable   Does patient have a medical provider? Yes   Patient referred to Not Applicable   Was a mental health screening completed? (GAINS tool) No   Does patient have dental issues? No   Since previous encounter, have you referred patient for abnormal blood pressure that resulted in a new diagnosis or medication change? No   Since previous encounter, have you referred patient for abnormal blood glucose that resulted in a new diagnosis or medication change? No   For Abstraction Use Only   Does patient have insurance? Yes       Clinic visit for B/P check.  Does have appointment with [facility] to establish with HCP

## 2015-12-02 DIAGNOSIS — I10 Essential (primary) hypertension: Secondary | ICD-10-CM

## 2015-12-17 NOTE — Congregational Nurse Program (Signed)
Congregational Nurse Program Note  Date of Encounter: 12/02/2015  Past Medical History: Past Medical History  Diagnosis Date  . Hypertension   . Asthma   . Bipolar 1 disorder 2015  . Substance abuse     Encounter Details:     CNP Questionnaire - 12/02/15 1249    Patient Demographics   Is this a new or existing patient? Existing   Patient is considered a/an Not Applicable   Race African-American/Black   Patient Assistance   Location of Patient Assistance Not Applicable   Patient's financial/insurance status Medicaid;Low Income   Uninsured Patient No   Patient referred to apply for the following financial assistance Not Applicable   Food insecurities addressed Provided food supplies   Transportation assistance No   Assistance securing medications No   Educational health offerings Chronic disease;Navigating the healthcare system;Hypertension   Encounter Details   Primary purpose of visit Chronic Illness/Condition Visit;Education/Health Concerns   Was an Emergency Department visit averted? Not Applicable   Does patient have a medical provider? Yes   Patient referred to Not Applicable;Clinic   Was a mental health screening completed? (GAINS tool) No   Does patient have dental issues? No   Since previous encounter, have you referred patient for abnormal blood pressure that resulted in a new diagnosis or medication change? No   Since previous encounter, have you referred patient for abnormal blood glucose that resulted in a new diagnosis or medication change? No   For Abstraction Use Only   Does patient have insurance? Yes       Clinic visit for B/P check.  Has an appointment with [facility] on 1/23.  Encouraged to return to clinic for B/P monitoring

## 2015-12-23 ENCOUNTER — Encounter (HOSPITAL_COMMUNITY): Payer: Self-pay | Admitting: *Deleted

## 2015-12-23 ENCOUNTER — Emergency Department (HOSPITAL_COMMUNITY)
Admission: EM | Admit: 2015-12-23 | Discharge: 2015-12-23 | Disposition: A | Discharge status: Home / Self Care | Payer: Medicare HMO | Attending: Emergency Medicine | Admitting: Emergency Medicine

## 2015-12-23 ENCOUNTER — Emergency Department (HOSPITAL_COMMUNITY): Payer: Medicare HMO

## 2015-12-23 DIAGNOSIS — J45909 Unspecified asthma, uncomplicated: Secondary | ICD-10-CM | POA: Insufficient documentation

## 2015-12-23 DIAGNOSIS — R509 Fever, unspecified: Secondary | ICD-10-CM | POA: Diagnosis not present

## 2015-12-23 DIAGNOSIS — J029 Acute pharyngitis, unspecified: Secondary | ICD-10-CM | POA: Diagnosis not present

## 2015-12-23 DIAGNOSIS — Z8659 Personal history of other mental and behavioral disorders: Secondary | ICD-10-CM | POA: Diagnosis not present

## 2015-12-23 DIAGNOSIS — R002 Palpitations: Secondary | ICD-10-CM | POA: Insufficient documentation

## 2015-12-23 DIAGNOSIS — F17200 Nicotine dependence, unspecified, uncomplicated: Secondary | ICD-10-CM | POA: Diagnosis not present

## 2015-12-23 DIAGNOSIS — R05 Cough: Secondary | ICD-10-CM | POA: Diagnosis not present

## 2015-12-23 DIAGNOSIS — J069 Acute upper respiratory infection, unspecified: Secondary | ICD-10-CM

## 2015-12-23 DIAGNOSIS — B9789 Other viral agents as the cause of diseases classified elsewhere: Secondary | ICD-10-CM

## 2015-12-23 DIAGNOSIS — I10 Essential (primary) hypertension: Secondary | ICD-10-CM | POA: Diagnosis not present

## 2015-12-23 LAB — BASIC METABOLIC PANEL
Anion gap: 9 (ref 5–15)
BUN: 6 mg/dL (ref 6–20)
CHLORIDE: 105 mmol/L (ref 101–111)
CO2: 24 mmol/L (ref 22–32)
Calcium: 8.7 mg/dL — ABNORMAL LOW (ref 8.9–10.3)
Creatinine, Ser: 1.12 mg/dL (ref 0.61–1.24)
GFR calc Af Amer: >60 mL/min (ref >60)
GFR calc non Af Amer: >60 mL/min (ref >60)
GLUCOSE: 117 mg/dL — AB (ref 65–99)
POTASSIUM: 4.5 mmol/L (ref 3.5–5.1)
Sodium: 138 mmol/L (ref 135–145)

## 2015-12-23 LAB — CBC
HEMATOCRIT: 42.8 % (ref 39.0–52.0)
Hemoglobin: 14.6 g/dL (ref 13.0–17.0)
MCH: 31.8 pg (ref 26.0–34.0)
MCHC: 34.1 g/dL (ref 30.0–36.0)
MCV: 93.2 fL (ref 78.0–100.0)
Platelets: 175 10*3/uL (ref 150–400)
RBC: 4.59 MIL/uL (ref 4.22–5.81)
RDW: 13.1 % (ref 11.5–15.5)
WBC: 3.4 10*3/uL — ABNORMAL LOW (ref 4.0–10.5)

## 2015-12-23 LAB — TROPONIN I: Troponin I: <0.03 ng/mL (ref <0.031)

## 2015-12-23 MED ORDER — AEROCHAMBER PLUS FLO-VU MEDIUM MISC
1.0000 | Freq: Once | Status: DC
  Administered 2015-12-23: 1

## 2015-12-23 MED ORDER — AEROCHAMBER PLUS W/MASK MISC
Status: AC
  Administered 2015-12-23: 1
  Filled 2015-12-23: qty 1

## 2015-12-23 MED ORDER — ALBUTEROL SULFATE HFA 108 (90 BASE) MCG/ACT IN AERS
2.0000 | INHALATION_SPRAY | RESPIRATORY_TRACT | Status: DC | PRN
  Administered 2015-12-23: 2 via RESPIRATORY_TRACT
  Filled 2015-12-23: qty 6.7

## 2015-12-23 MED ORDER — GUAIFENESIN ER 1200 MG PO TB12: 1.0000 | ORAL_TABLET | Freq: Two times a day (BID) | ORAL | Status: AC

## 2015-12-23 MED ORDER — SODIUM CHLORIDE 0.9 % IV BOLUS (SEPSIS)
1000.0000 mL | Freq: Once | INTRAVENOUS | Status: AC
  Administered 2015-12-23: 1000 mL via INTRAVENOUS

## 2015-12-23 MED ORDER — PREDNISONE 50 MG PO TABS: 50.0000 mg | ORAL_TABLET | Freq: Every day | ORAL | Status: DC

## 2015-12-23 MED ORDER — KETOROLAC TROMETHAMINE 30 MG/ML IJ SOLN
30.0000 mg | Freq: Once | INTRAMUSCULAR | Status: AC
  Administered 2015-12-23: 30 mg via INTRAVENOUS
  Filled 2015-12-23: qty 1

## 2015-12-23 MED ORDER — PROMETHAZINE-DM 6.25-15 MG/5ML PO SYRP: 5.0000 mL | ORAL_SOLUTION | Freq: Four times a day (QID) | ORAL | Status: AC | PRN

## 2015-12-23 NOTE — ED Notes (Signed)
Ambulated Pt. 97% SpO2 and 101 pulse before ambulation. 98% SpO2 and 96 pulse after ambulation. Pt states he felt "good and is no longer dizzy."

## 2015-12-23 NOTE — Discharge Instructions (Signed)
Return here as needed.  Increase your fluid intake and rest as much possible °

## 2015-12-23 NOTE — ED Provider Notes (Signed)
CSN: [id number]     Arrival date & time 12/23/15  1521 History   First MD Initiated Contact with Patient 12/23/15 1644     Chief Complaint  Patient presents with  . Dizziness     (Consider location/radiation/quality/duration/timing/severity/associated sxs/prior Treatment) HPI Patient presents to the emergency department with cough.  Started on Tuesday.  The patient states that he is a smoker and has had a productive cough.  Patient states that the only time his chest hurts is when he is coughing.  The patient denies weakness, numbness, dizziness, headache, blurred vision, back pain, neck pain, fever, dysuria, incontinence, bloody stool, hematemesis, abdominal pain, edema, near syncope or syncope.  The patient states he did not take any medications prior to arrival.  He states that he had some palpitations earlier in the week Past Medical History  Diagnosis Date  . Hypertension   . Asthma   . Bipolar 1 disorder (HCC) 2015  . Substance abuse    History reviewed. No pertinent past surgical history. No family history on file. Social History  Substance Use Topics  . Smoking status: Light Tobacco Smoker  . Smokeless tobacco: None  . Alcohol Use: 0.0 oz/week    Review of Systems  All other systems negative except as documented in the HPI. All pertinent positives and negatives as reviewed in the HPI.  Allergies  Review of patient's allergies indicates no known allergies.  Home Medications   Prior to Admission medications   Medication Sig Start Date End Date Taking? Authorizing Provider  acetaminophen (TYLENOL) 500 MG tablet Take 1,000 mg by mouth every 6 (six) hours as needed. Reported on 12/23/2015    Historical Provider, MD  cyclobenzaprine (FLEXERIL) 10 MG tablet Take 1 tablet (10 mg total) by mouth 2 (two) times daily as needed for muscle spasms. 07/26/15   Roxy Horseman, PA-C  ibuprofen (ADVIL,MOTRIN) 600 MG tablet Take 1 tablet (600 mg total) by mouth every 6 (six) hours as  needed. Patient not taking: Reported on 12/23/2015 07/26/15   Roxy Horseman, PA-C   BP 156/84 mmHg  Pulse 85  Temp(Src) 100.1 F (37.8 C)  Resp 18  Ht  (1.803 m)  Wt 77.253 kg  BMI 23.76 kg/m2  SpO2 97% Physical Exam  Constitutional: He is oriented to person, place, and time. He appears well-developed and well-nourished. No distress.  HENT:  Head: Normocephalic and atraumatic.  Mouth/Throat: Oropharynx is clear and moist.  Eyes: Pupils are equal, round, and reactive to light.  Neck: Normal range of motion. Neck supple.  Cardiovascular: Normal rate, regular rhythm and normal heart sounds.  Exam reveals no gallop and no friction rub.   No murmur heard. Pulmonary/Chest: Effort normal and breath sounds normal. No respiratory distress. He has no wheezes.  Abdominal: Soft. Bowel sounds are normal. He exhibits no distension. There is no tenderness.  Neurological: He is alert and oriented to person, place, and time. He exhibits normal muscle tone. Coordination normal.  Skin: Skin is warm and dry. No rash noted. No erythema.  Psychiatric: He has a normal mood and affect. His behavior is normal.  Nursing note and vitals reviewed.   ED Course  Procedures (including critical care time) Labs Review Labs Reviewed  BASIC METABOLIC PANEL - Abnormal; Notable for the following:    Glucose, Bld 117 (*)    Calcium 8.7 (*)    All other components within normal limits  CBC - Abnormal; Notable for the following:    WBC 3.4 (*)  All other components within normal limits  TROPONIN I    Imaging Review Dg Chest 2 View  12/23/2015  CLINICAL DATA:  55 year old male with chest pain productive cough dizziness and fever since last night. Initial encounter. EXAM: CHEST  2 VIEW COMPARISON:  11/08/2012. FINDINGS: Lung volumes are stable and within normal limits. Normal cardiac size and mediastinal contours. Visualized tracheal air column is within normal limits. EKG button artifact over both upper  lobes an the left lung base. The lungs appear clear with no pneumothorax, pulmonary edema, pleural effusion or confluent pulmonary opacity. No acute osseous abnormality identified. IMPRESSION: Negative, no acute cardiopulmonary abnormality. Electronically Signed   By: Odessa Fleming M.D.   On: 12/23/2015 16:02   I have personally reviewed and evaluated these images and lab results as part of my medical decision-making.   EKG Interpretation   Date/Time:  Thursday December 23 2015 15:26:45 EST Ventricular Rate:  94 PR Interval:  140 QRS Duration: 94 QT Interval:  328 QTC Calculation: 410 R Axis:   91 Text Interpretation:  Normal sinus rhythm Rightward axis Incomplete right  bundle branch block Borderline ECG No significant change since last  tracing Confirmed by Bebe Shaggy  MD, DONALD ([id number]) on 12/23/2015 4:55:52 PM       Patient be treated for URI/bronchitis.  Patient does not have any wheezing on examination.  He does have some low-grade temps.  He is advised to increase his fluid intake and rest as much possible.  The patient is feeling some better following IV fluids.  Patient agrees the plan and all questions were answered   Charlestine Night, PA-C 12/23/15 1933  Zadie Rhine, MD 12/24/15 [phone]

## 2015-12-23 NOTE — ED Notes (Signed)
The pt has had dizziness rapid heart rate cough productive  White  Chest congestion temp unknown.  He is a smoker  He started with these symptoms Tuesday  Chest pain also especially with coughing

## 2016-01-16 ENCOUNTER — Encounter (HOSPITAL_COMMUNITY): Payer: Self-pay | Admitting: *Deleted

## 2016-01-16 ENCOUNTER — Emergency Department (HOSPITAL_COMMUNITY)
Admission: EM | Admit: 2016-01-16 | Discharge: 2016-01-16 | Disposition: A | Discharge status: Home / Self Care | Payer: Medicare HMO | Attending: Emergency Medicine | Admitting: Emergency Medicine

## 2016-01-16 DIAGNOSIS — T148XXA Other injury of unspecified body region, initial encounter: Secondary | ICD-10-CM

## 2016-01-16 DIAGNOSIS — Z7952 Long term (current) use of systemic steroids: Secondary | ICD-10-CM | POA: Insufficient documentation

## 2016-01-16 DIAGNOSIS — Y9289 Other specified places as the place of occurrence of the external cause: Secondary | ICD-10-CM | POA: Diagnosis not present

## 2016-01-16 DIAGNOSIS — I10 Essential (primary) hypertension: Secondary | ICD-10-CM | POA: Insufficient documentation

## 2016-01-16 DIAGNOSIS — F17200 Nicotine dependence, unspecified, uncomplicated: Secondary | ICD-10-CM | POA: Insufficient documentation

## 2016-01-16 DIAGNOSIS — S39012A Strain of muscle, fascia and tendon of lower back, initial encounter: Secondary | ICD-10-CM | POA: Insufficient documentation

## 2016-01-16 DIAGNOSIS — Y9389 Activity, other specified: Secondary | ICD-10-CM | POA: Insufficient documentation

## 2016-01-16 DIAGNOSIS — X58XXXA Exposure to other specified factors, initial encounter: Secondary | ICD-10-CM | POA: Diagnosis not present

## 2016-01-16 DIAGNOSIS — J45909 Unspecified asthma, uncomplicated: Secondary | ICD-10-CM | POA: Diagnosis not present

## 2016-01-16 DIAGNOSIS — Y998 Other external cause status: Secondary | ICD-10-CM | POA: Diagnosis not present

## 2016-01-16 DIAGNOSIS — M545 Low back pain, unspecified: Secondary | ICD-10-CM

## 2016-01-16 DIAGNOSIS — Z8659 Personal history of other mental and behavioral disorders: Secondary | ICD-10-CM | POA: Insufficient documentation

## 2016-01-16 DIAGNOSIS — Z79899 Other long term (current) drug therapy: Secondary | ICD-10-CM | POA: Insufficient documentation

## 2016-01-16 DIAGNOSIS — S3992XA Unspecified injury of lower back, initial encounter: Secondary | ICD-10-CM | POA: Diagnosis present

## 2016-01-16 MED ORDER — CYCLOBENZAPRINE HCL 10 MG PO TABS: 10.0000 mg | ORAL_TABLET | Freq: Two times a day (BID) | ORAL | Status: AC | PRN

## 2016-01-16 MED ORDER — IBUPROFEN 400 MG PO TABS
800.0000 mg | ORAL_TABLET | Freq: Once | ORAL | Status: AC
  Administered 2016-01-16: 800 mg via ORAL
  Filled 2016-01-16: qty 2

## 2016-01-16 MED ORDER — IBUPROFEN 800 MG PO TABS: 800.0000 mg | ORAL_TABLET | Freq: Three times a day (TID) | ORAL | Status: AC

## 2016-01-16 NOTE — ED Notes (Signed)
Pt reports lower back was fine until this morning.

## 2016-01-16 NOTE — ED Provider Notes (Signed)
CSN: [id number]     Arrival date & time 01/16/16  1356 History  By signing my name below, I, Octavia Heir, attest that this documentation has been prepared under the direction and in the presence of Cheri Fowler, PA-C. Electronically Signed: Octavia Heir, ED Scribe. 01/16/2016. 2:40 PM.    Chief Complaint  Patient presents with  . Back Pain     The history is provided by the patient. No language interpreter was used.   HPI Comments: Stuart Bell is a 55 y.o. male who has a hx of HTN and substance abuse presents to the Emergency Department complaining of constant, gradual worsening lower back pain onset this morning. He reports the pain is on his left side and is starting to radiate to his right side. Pt states he was helping someone move with a wheel barrel yesterday but denies any injury or fall. Pt endorses increased pain with movement and while he is ambulating. Pt has not taken any medication to alleviate his pain. He denies numbness, tingling, abdominal pain, dysuria, bladder/bowel incontinence, iv drug use, hx of cancer, and fever.   Past Medical History  Diagnosis Date  . Hypertension   . Asthma   . Bipolar 1 disorder (HCC) 2015  . Substance abuse    History reviewed. No pertinent past surgical history. History reviewed. No pertinent family history. Social History  Substance Use Topics  . Smoking status: Light Tobacco Smoker  . Smokeless tobacco: None  . Alcohol Use: 0.0 oz/week    Review of Systems  Constitutional: Negative for fever.  Gastrointestinal: Negative for abdominal pain.  Genitourinary: Negative for dysuria.  Musculoskeletal: Positive for back pain.  Neurological: Negative for weakness and numbness.  All other systems reviewed and are negative.     Allergies  Review of patient's allergies indicates no known allergies.  Home Medications   Prior to Admission medications   Medication Sig Start Date End Date Taking? Authorizing Provider   acetaminophen (TYLENOL) 500 MG tablet Take 1,000 mg by mouth every 6 (six) hours as needed. Reported on 12/23/2015    Historical Provider, MD  cyclobenzaprine (FLEXERIL) 10 MG tablet Take 1 tablet (10 mg total) by mouth 2 (two) times daily as needed for muscle spasms. 01/16/16   Cheri Fowler, PA-C  Guaifenesin 1200 MG TB12 Take 1 tablet (1,200 mg total) by mouth 2 (two) times daily. 12/23/15   Charlestine Night, PA-C  ibuprofen (ADVIL,MOTRIN) 800 MG tablet Take 1 tablet (800 mg total) by mouth 3 (three) times daily. 01/16/16   Cheri Fowler, PA-C  predniSONE (DELTASONE) 50 MG tablet Take 1 tablet (50 mg total) by mouth daily. 12/23/15   Charlestine Night, PA-C  promethazine-dextromethorphan (PROMETHAZINE-DM) 6.25-15 MG/5ML syrup Take 5 mLs by mouth 4 (four) times daily as needed for cough. 12/23/15   Charlestine Night, PA-C   Triage vitals: BP 143/86 mmHg  Pulse 86  Temp(Src) 97.9 F (36.6 C) (Oral)  Resp 18  Ht  (1.854 m)  Wt 171 lb (77.565 kg)  BMI 22.57 kg/m2  SpO2 99% Physical Exam  Constitutional: He is oriented to person, place, and time. He appears well-developed and well-nourished.  HENT:  Head: Atraumatic.  Eyes: Conjunctivae are normal.  Cardiovascular: Normal rate, regular rhythm, normal heart sounds and intact distal pulses.   Pulses:      Dorsalis pedis pulses are 2+ on the right side, and 2+ on the left side.  Pulmonary/Chest: Effort normal and breath sounds normal.  Abdominal: Soft. Bowel sounds are normal. He  exhibits no distension. There is no tenderness.  Musculoskeletal: He exhibits tenderness.  No spinous process tenderness.  No step offs. No crepitus.  Bilateral lumbar musculature tenderness.  Neurological: He is alert and oriented to person, place, and time.  No saddle anesthesia. Strength and sensation intact bilaterally throughout lower extremities.  Gait normal.   Skin: Skin is warm and dry.  Psychiatric: He has a normal mood and affect. His behavior is normal.   Nursing note and vitals reviewed.   ED Course  Procedures  DIAGNOSTIC STUDIES: Oxygen Saturation is 99% on RA, normal by my interpretation.  COORDINATION OF CARE:  2:38 PM Discussed treatment plan which includes muscle relaxer and 800 mg of ibuprofen with pt at bedside and pt agreed to plan.  Labs Review Labs Reviewed - No data to display  Imaging Review No results found. I have personally reviewed and evaluated these images and lab results as part of my medical decision-making.   EKG Interpretation None      MDM  Patient with back pain.  No neurological deficits and normal neuro exam.  Patient is ambulatory.  No loss of bowel or bladder control.  No concern for cauda equina.  No fever, night sweats, weight loss, h/o cancer, IVDA, no recent procedure to back. No urinary symptoms suggestive of UTI.  Supportive care and return precaution discussed. Appears safe for discharge at this time. Follow up as indicated in discharge paperwork.   Final diagnoses:  Bilateral low back pain without sciatica  Muscle strain     I personally performed the services described in this documentation, which was scribed in my presence. The recorded information has been reviewed and is accurate.    Cheri Fowler, PA-C 01/16/16 1456  Blane Ohara, MD 01/16/16 1539

## 2016-01-16 NOTE — Discharge Instructions (Signed)

## 2016-01-16 NOTE — ED Notes (Signed)
Declined W/C at D/C and was escorted to lobby by RN. 

## 2016-01-27 DIAGNOSIS — I10 Essential (primary) hypertension: Secondary | ICD-10-CM

## 2016-02-01 DIAGNOSIS — I10 Essential (primary) hypertension: Secondary | ICD-10-CM

## 2016-02-01 NOTE — Congregational Nurse Program (Signed)
Congregational Nurse Program Note  Date of Encounter: 01/27/2016  Past Medical History: Past Medical History  Diagnosis Date  . Hypertension   . Asthma   . Bipolar 1 disorder (HCC) 2015  . Substance abuse     Encounter Details:     CNP Questionnaire - 02/01/16 1540    Patient Demographics   Is this a new or existing patient? Existing   Patient is considered a/an Not Applicable   Race African-American/Black   Patient Assistance   Location of Patient Assistance Not Applicable   Patient's financial/insurance status Medicaid;Low Income   Uninsured Patient No   Patient referred to apply for the following financial assistance Not Applicable   Food insecurities addressed Provided food supplies   Transportation assistance No   Assistance securing medications No   Educational health offerings Chronic disease;Navigating the healthcare system;Hypertension   Encounter Details   Primary purpose of visit Chronic Illness/Condition Visit;Education/Health Concerns   Was an Emergency Department visit averted? Not Applicable   Does patient have a medical provider? Yes   Patient referred to Not Applicable;Clinic   Was a mental health screening completed? (GAINS tool) No   Does patient have dental issues? No   Does patient have vision issues? No   Since previous encounter, have you referred patient for abnormal blood pressure that resulted in a new diagnosis or medication change? No   Since previous encounter, have you referred patient for abnormal blood glucose that resulted in a new diagnosis or medication change? No   For Abstraction Use Only   Does patient have insurance? Yes       Blood pressure check

## 2016-02-01 NOTE — Congregational Nurse Program (Signed)
Congregational Nurse Program Note  Date of Encounter: 02/01/2016  Past Medical History: Past Medical History  Diagnosis Date  . Hypertension   . Asthma   . Bipolar 1 disorder (HCC) 2015  . Substance abuse     Encounter Details:     CNP Questionnaire - 02/01/16 1540    Patient Demographics   Is this a new or existing patient? Existing   Patient is considered a/an Not Applicable   Race African-American/Black   Patient Assistance   Location of Patient Assistance Not Applicable   Patient's financial/insurance status Medicaid;Low Income   Uninsured Patient No   Patient referred to apply for the following financial assistance Not Applicable   Food insecurities addressed Provided food supplies   Transportation assistance No   Assistance securing medications No   Educational health offerings Chronic disease;Navigating the healthcare system;Hypertension   Encounter Details   Primary purpose of visit Chronic Illness/Condition Visit;Education/Health Concerns   Was an Emergency Department visit averted? Not Applicable   Does patient have a medical provider? Yes   Patient referred to Not Applicable;Clinic   Was a mental health screening completed? (GAINS tool) No   Does patient have dental issues? No   Does patient have vision issues? No   Since previous encounter, have you referred patient for abnormal blood pressure that resulted in a new diagnosis or medication change? No   Since previous encounter, have you referred patient for abnormal blood glucose that resulted in a new diagnosis or medication change? No   For Abstraction Use Only   Does patient have insurance? Yes       Requested blood pressure check

## 2016-02-08 DIAGNOSIS — I10 Essential (primary) hypertension: Secondary | ICD-10-CM

## 2016-02-08 NOTE — Congregational Nurse Program (Signed)
Congregational Nurse Program Note  Date of Encounter: 02/08/2016  Past Medical History: Past Medical History  Diagnosis Date  . Hypertension   . Asthma   . Bipolar 1 disorder (HCC) 2015  . Substance abuse     Encounter Details:     CNP Questionnaire - 02/08/16 1428    Patient Demographics   Is this a new or existing patient? Existing   Patient is considered a/an Not Applicable   Race African-American/Black   Patient Assistance   Location of Patient Assistance Not Applicable   Patient's financial/insurance status Medicaid;Low Income   Uninsured Patient No   Patient referred to apply for the following financial assistance Not Applicable   Food insecurities addressed Provided food supplies   Transportation assistance No   Assistance securing medications No   Educational health offerings Chronic disease;Navigating the healthcare system;Hypertension   Encounter Details   Primary purpose of visit Chronic Illness/Condition Visit;Education/Health Concerns   Was an Emergency Department visit averted? Not Applicable   Does patient have a medical provider? Yes   Patient referred to Not Applicable;Clinic   Was a mental health screening completed? (GAINS tool) No   Does patient have dental issues? No   Does patient have vision issues? No   Since previous encounter, have you referred patient for abnormal blood pressure that resulted in a new diagnosis or medication change? No   Since previous encounter, have you referred patient for abnormal blood glucose that resulted in a new diagnosis or medication change? No   For Abstraction Use Only   Does patient have insurance? Yes       B/P check.  Elevated 130/94.  States had just finished smoking.  Encouraged to come back for a recheck

## 2016-02-09 DIAGNOSIS — Z720 Tobacco use: Secondary | ICD-10-CM | POA: Diagnosis not present

## 2016-02-09 DIAGNOSIS — Z09 Encounter for follow-up examination after completed treatment for conditions other than malignant neoplasm: Secondary | ICD-10-CM | POA: Diagnosis not present

## 2016-02-09 DIAGNOSIS — Z7189 Other specified counseling: Secondary | ICD-10-CM | POA: Diagnosis not present

## 2016-02-09 DIAGNOSIS — I10 Essential (primary) hypertension: Secondary | ICD-10-CM | POA: Diagnosis not present

## 2016-02-10 DIAGNOSIS — I10 Essential (primary) hypertension: Secondary | ICD-10-CM

## 2016-02-15 DIAGNOSIS — I10 Essential (primary) hypertension: Secondary | ICD-10-CM

## 2016-02-17 NOTE — Congregational Nurse Program (Signed)
Congregational Nurse Program Note  Date of Encounter: 02/08/2016  Past Medical History: Past Medical History  Diagnosis Date  . Hypertension   . Asthma   . Bipolar 1 disorder (HCC) 2015  . Substance abuse     Encounter Details:     CNP Questionnaire - 02/08/16 1428    Patient Demographics   Is this a new or existing patient? Existing   Patient is considered a/an Not Applicable   Race African-American/Black   Patient Assistance   Location of Patient Assistance Not Applicable   Patient's financial/insurance status Medicaid;Low Income   Uninsured Patient No   Patient referred to apply for the following financial assistance Not Applicable   Food insecurities addressed Provided food supplies   Transportation assistance No   Assistance securing medications No   Educational health offerings Chronic disease;Navigating the healthcare system;Hypertension   Encounter Details   Primary purpose of visit Chronic Illness/Condition Visit;Education/Health Concerns   Was an Emergency Department visit averted? Not Applicable   Does patient have a medical provider? Yes   Patient referred to Not Applicable;Clinic   Was a mental health screening completed? (GAINS tool) No   Does patient have dental issues? No   Does patient have vision issues? No   Since previous encounter, have you referred patient for abnormal blood pressure that resulted in a new diagnosis or medication change? No   Since previous encounter, have you referred patient for abnormal blood glucose that resulted in a new diagnosis or medication change? No   For Abstraction Use Only   Does patient have insurance? Yes     B/P check

## 2016-02-17 NOTE — Congregational Nurse Program (Signed)
Congregational Nurse Program Note  Date of Encounter: 02/10/2016  Past Medical History: Past Medical History  Diagnosis Date  . Hypertension   . Asthma   . Bipolar 1 disorder (HCC) 2015  . Substance abuse     Encounter Details:     CNP Questionnaire - 02/10/16 0841    Patient Demographics   Is this a new or existing patient? Existing   Patient is considered a/an Not Applicable   Race African-American/Black   Patient Assistance   Location of Patient Assistance Not Applicable   Patient's financial/insurance status Medicaid;Low Income   Uninsured Patient No   Patient referred to apply for the following financial assistance Not Applicable   Food insecurities addressed Provided food supplies   Transportation assistance No   Assistance securing medications No   Educational health offerings Chronic disease;Navigating the healthcare system;Hypertension   Encounter Details   Primary purpose of visit Chronic Illness/Condition Visit;Education/Health Concerns   Was an Emergency Department visit averted? Not Applicable   Does patient have a medical provider? Yes   Patient referred to Not Applicable;Clinic   Was a mental health screening completed? (GAINS tool) No   Does patient have dental issues? No   Does patient have vision issues? No   Since previous encounter, have you referred patient for abnormal blood pressure that resulted in a new diagnosis or medication change? No   Since previous encounter, have you referred patient for abnormal blood glucose that resulted in a new diagnosis or medication change? No   For Abstraction Use Only   Does patient have insurance? Yes       B/P check.  Has started on HCTZ 25 mg daily.
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
## 2016-03-09 DIAGNOSIS — I10 Essential (primary) hypertension: Secondary | ICD-10-CM
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
## 2016-03-10 ENCOUNTER — Encounter (HOSPITAL_COMMUNITY): Payer: Self-pay | Admitting: Emergency Medicine

## 2016-03-10 ENCOUNTER — Emergency Department (HOSPITAL_COMMUNITY)
Admission: EM | Admit: 2016-03-10 | Discharge: 2016-03-10 | Disposition: A | Discharge status: Home / Self Care | Payer: Medicare HMO | Attending: Emergency Medicine | Admitting: Emergency Medicine

## 2016-03-10 DIAGNOSIS — J45909 Unspecified asthma, uncomplicated: Secondary | ICD-10-CM | POA: Insufficient documentation

## 2016-03-10 DIAGNOSIS — F17200 Nicotine dependence, unspecified, uncomplicated: Secondary | ICD-10-CM | POA: Diagnosis not present

## 2016-03-10 DIAGNOSIS — Z7952 Long term (current) use of systemic steroids: Secondary | ICD-10-CM | POA: Insufficient documentation

## 2016-03-10 DIAGNOSIS — Z8659 Personal history of other mental and behavioral disorders: Secondary | ICD-10-CM | POA: Insufficient documentation

## 2016-03-10 DIAGNOSIS — I10 Essential (primary) hypertension: Secondary | ICD-10-CM | POA: Diagnosis not present

## 2016-03-10 DIAGNOSIS — Z79899 Other long term (current) drug therapy: Secondary | ICD-10-CM | POA: Insufficient documentation

## 2016-03-10 DIAGNOSIS — Z791 Long term (current) use of non-steroidal anti-inflammatories (NSAID): Secondary | ICD-10-CM | POA: Insufficient documentation

## 2016-03-10 NOTE — Discharge Instructions (Signed)
Hypertension Hypertension, commonly called high blood pressure, is when the force of blood pumping through your arteries is too strong. Your arteries are the blood vessels that carry blood from your heart throughout your body. A blood pressure reading consists of a higher number over a lower number, such as 110/72. The higher number (systolic) is the pressure inside your arteries when your heart pumps. The lower number (diastolic) is the pressure inside your arteries when your heart relaxes. Ideally you want your blood pressure below 120/80. Hypertension forces your heart to work harder to pump blood. Your arteries may become narrow or stiff. Having untreated or uncontrolled hypertension can cause heart attack, stroke, kidney disease, and other problems. RISK FACTORS Some risk factors for high blood pressure are controllable. Others are not.  Risk factors you cannot control include:   Race. You may be at higher risk if you are African American.  Age. Risk increases with age.  Gender. Men are at higher risk than women before age 45 years. After age 65, women are at higher risk than men. Risk factors you can control include:  Not getting enough exercise or physical activity.  Being overweight.  Getting too much fat, sugar, calories, or salt in your diet.  Drinking too much alcohol. SIGNS AND SYMPTOMS Hypertension does not usually cause signs or symptoms. Extremely high blood pressure (hypertensive crisis) may cause headache, anxiety, shortness of breath, and nosebleed. DIAGNOSIS To check if you have hypertension, your health care provider will measure your blood pressure while you are seated, with your arm held at the level of your heart. It should be measured at least twice using the same arm. Certain conditions can cause a difference in blood pressure between your right and left arms. A blood pressure reading that is higher than normal on one occasion does not mean that you need treatment. If  it is not clear whether you have high blood pressure, you may be asked to return on a different day to have your blood pressure checked again. Or, you may be asked to monitor your blood pressure at home for 1 or more weeks. TREATMENT Treating high blood pressure includes making lifestyle changes and possibly taking medicine. Living a healthy lifestyle can help lower high blood pressure. You may need to change some of your habits. Lifestyle changes may include:  Following the DASH diet. This diet is high in fruits, vegetables, and whole grains. It is low in salt, red meat, and added sugars.  Keep your sodium intake below 2,300 mg per day.  Getting at least 30-45 minutes of aerobic exercise at least 4 times per week.  Losing weight if necessary.  Not smoking.  Limiting alcoholic beverages.  Learning ways to reduce stress. Your health care provider may prescribe medicine if lifestyle changes are not enough to get your blood pressure under control, and if one of the following is true:  You are 18-59 years of age and your systolic blood pressure is above 140.  You are 60 years of age or older, and your systolic blood pressure is above 150.  Your diastolic blood pressure is above 90.  You have diabetes, and your systolic blood pressure is over 140 or your diastolic blood pressure is over 90.  You have kidney disease and your blood pressure is above 140/90.  You have heart disease and your blood pressure is above 140/90. Your personal target blood pressure may vary depending on your medical conditions, your age, and other factors. HOME CARE INSTRUCTIONS    Have your blood pressure rechecked as directed by your health care provider.   Take medicines only as directed by your health care provider. Follow the directions carefully. Blood pressure medicines must be taken as prescribed. The medicine does not work as well when you skip doses. Skipping doses also puts you at risk for  problems.  Do not smoke.   Monitor your blood pressure at home as directed by your health care provider. SEEK MEDICAL CARE IF:   You think you are having a reaction to medicines taken.  You have recurrent headaches or feel dizzy.  You have swelling in your ankles.  You have trouble with your vision. SEEK IMMEDIATE MEDICAL CARE IF:  You develop a severe headache or confusion.  You have unusual weakness, numbness, or feel faint.  You have severe chest or abdominal pain.  You vomit repeatedly.  You have trouble breathing. MAKE SURE YOU:   Understand these instructions.  Will watch your condition.  Will get help right away if you are not doing well or get worse.   This information is not intended to replace advice given to you by your health care provider. Make sure you discuss any questions you have with your health care provider.   Document Released: 11/06/2005 Document Revised: 03/23/2015 Document Reviewed: 08/29/2013 Elsevier Interactive Patient Education 2016 [employer] Inc[employer]  
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
## 2016-03-10 NOTE — ED Notes (Signed)
Pt sts hx of htn and sts told his BP was high several days ago and here to have a check; pt denies complaint and sts taking his meds

## 2016-03-13 DIAGNOSIS — I10 Essential (primary) hypertension: Secondary | ICD-10-CM

## 2016-03-13 DIAGNOSIS — Z09 Encounter for follow-up examination after completed treatment for conditions other than malignant neoplasm: Secondary | ICD-10-CM | POA: Diagnosis not present

## 2016-03-13 DIAGNOSIS — Z720 Tobacco use: Secondary | ICD-10-CM | POA: Diagnosis not present

## 2016-03-13 DIAGNOSIS — Z7189 Other specified counseling: Secondary | ICD-10-CM | POA: Diagnosis not present

## 2016-03-16 NOTE — Congregational Nurse Program (Signed)
Congregational Nurse Program Note  Date of Encounter: 03/13/2016  Past Medical History: Past Medical History  Diagnosis Date  . Hypertension   . Asthma   . Bipolar 1 disorder (HCC) 2015  . Substance abuse     Encounter Details:     CNP Questionnaire - 03/14/16 1515    Patient Demographics   Is this a new or existing patient? Existing   Patient is considered a/an Not Applicable   Race African-American/Black   Patient Assistance   Location of Patient Assistance Not Applicable   Patient's financial/insurance status Medicaid;Low Income   Uninsured Patient No   Patient referred to apply for the following financial assistance Not Applicable   Food insecurities addressed Provided food supplies   Transportation assistance No   Assistance securing medications No   Educational health offerings Chronic disease;Navigating the healthcare system;Hypertension   Encounter Details   Primary purpose of visit Chronic Illness/Condition Visit;Education/Health Concerns   Was an Emergency Department visit averted? Not Applicable   Does patient have a medical provider? Yes   Patient referred to Not Applicable;Clinic   Was a mental health screening completed? (GAINS tool) No   Does patient have dental issues? No   Does patient have vision issues? No   Since previous encounter, have you referred patient for abnormal blood pressure that resulted in a new diagnosis or medication change? No   Since previous encounter, have you referred patient for abnormal blood glucose that resulted in a new diagnosis or medication change? No   For Abstraction Use Only   Does patient have insurance? Yes       B/P check

## 2016-03-21 NOTE — Congregational Nurse Program (Signed)
Congregational Nurse Program Note  Date of Encounter: 03/13/2016  Past Medical History: Past Medical History  Diagnosis Date  . Hypertension   . Asthma   . Bipolar 1 disorder (HCC) 2015  . Substance abuse     Encounter Details:     CNP Questionnaire - 03/14/16 1515    Patient Demographics   Is this a new or existing patient? Existing   Patient is considered a/an Not Applicable   Race African-American/Black   Patient Assistance   Location of Patient Assistance Not Applicable   Patient's financial/insurance status Medicaid;Low Income   Uninsured Patient No   Patient referred to apply for the following financial assistance Not Applicable   Food insecurities addressed Provided food supplies   Transportation assistance No   Assistance securing medications No   Educational health offerings Chronic disease;Navigating the healthcare system;Hypertension   Encounter Details   Primary purpose of visit Chronic Illness/Condition Visit;Education/Health Concerns   Was an Emergency Department visit averted? Not Applicable   Does patient have a medical provider? Yes   Patient referred to Not Applicable;Clinic   Was a mental health screening completed? (GAINS tool) No   Does patient have dental issues? No   Does patient have vision issues? No   Since previous encounter, have you referred patient for abnormal blood pressure that resulted in a new diagnosis or medication change? No   Since previous encounter, have you referred patient for abnormal blood glucose that resulted in a new diagnosis or medication change? No   For Abstraction Use Only   Does patient have insurance? Yes      B/P check.  Client brought B/P meds for me to document.  Lisinopril 20 mg daily added to his hydrochlorothiazide.  B/P today 120/78

## 2016-03-28 DIAGNOSIS — I10 Essential (primary) hypertension: Secondary | ICD-10-CM

## 2016-04-27 NOTE — Congregational Nurse Program (Signed)
Congregational Nurse Program Note  Date of Encounter: 03/28/2016  Past Medical History: Past Medical History  Diagnosis Date  . Hypertension   . Asthma   . Bipolar 1 disorder (HCC) 2015  . Substance abuse     Encounter Details:    B/P check/  90/60.  Client stated since he had missed his B/P medication yesterday he "doubled up" today.  Discussed with client the importance of not doubling his medication.  That he should just take the normal dose the next day.  Encouraged him to increase fluids due to low B/P.

## 2016-09-14 DIAGNOSIS — I10 Essential (primary) hypertension: Secondary | ICD-10-CM | POA: Diagnosis not present

## 2016-09-14 DIAGNOSIS — F17210 Nicotine dependence, cigarettes, uncomplicated: Secondary | ICD-10-CM | POA: Diagnosis not present

## 2016-09-14 DIAGNOSIS — J4530 Mild persistent asthma, uncomplicated: Secondary | ICD-10-CM | POA: Diagnosis not present

## 2016-11-19 DIAGNOSIS — I10 Essential (primary) hypertension: Secondary | ICD-10-CM | POA: Diagnosis not present

## 2016-11-19 DIAGNOSIS — F329 Major depressive disorder, single episode, unspecified: Secondary | ICD-10-CM | POA: Diagnosis not present

## 2016-12-20 DIAGNOSIS — I10 Essential (primary) hypertension: Secondary | ICD-10-CM | POA: Diagnosis not present

## 2016-12-20 DIAGNOSIS — F329 Major depressive disorder, single episode, unspecified: Secondary | ICD-10-CM | POA: Diagnosis not present

## 2017-01-17 DIAGNOSIS — F17200 Nicotine dependence, unspecified, uncomplicated: Secondary | ICD-10-CM | POA: Diagnosis not present

## 2017-01-17 DIAGNOSIS — I10 Essential (primary) hypertension: Secondary | ICD-10-CM | POA: Diagnosis not present

## 2017-01-17 DIAGNOSIS — J019 Acute sinusitis, unspecified: Secondary | ICD-10-CM | POA: Diagnosis not present

## 2017-02-17 DIAGNOSIS — F17200 Nicotine dependence, unspecified, uncomplicated: Secondary | ICD-10-CM | POA: Diagnosis not present

## 2017-02-17 DIAGNOSIS — I10 Essential (primary) hypertension: Secondary | ICD-10-CM | POA: Diagnosis not present

## 2017-02-22 DIAGNOSIS — I10 Essential (primary) hypertension: Secondary | ICD-10-CM | POA: Diagnosis not present

## 2017-02-22 DIAGNOSIS — J069 Acute upper respiratory infection, unspecified: Secondary | ICD-10-CM | POA: Diagnosis not present

## 2017-02-28 ENCOUNTER — Telehealth: Payer: Self-pay

## 2017-02-28 NOTE — Telephone Encounter (Signed)
I spoke with the patient who stated that his primary care doctor is [place] on [facility] [place].  He couldn't remember the PCP's name or the name of the practice, but confirmed it's his PCP. V McCain ([facility])

## 2017-03-19 DIAGNOSIS — F17200 Nicotine dependence, unspecified, uncomplicated: Secondary | ICD-10-CM | POA: Diagnosis not present

## 2017-03-19 DIAGNOSIS — I10 Essential (primary) hypertension: Secondary | ICD-10-CM | POA: Diagnosis not present

## 2017-03-23 DIAGNOSIS — M545 Low back pain: Secondary | ICD-10-CM | POA: Diagnosis not present

## 2017-03-23 DIAGNOSIS — I10 Essential (primary) hypertension: Secondary | ICD-10-CM | POA: Diagnosis not present

## 2017-03-23 DIAGNOSIS — Z13220 Encounter for screening for lipoid disorders: Secondary | ICD-10-CM | POA: Diagnosis not present

## 2017-03-23 DIAGNOSIS — F1010 Alcohol abuse, uncomplicated: Secondary | ICD-10-CM | POA: Diagnosis not present

## 2017-04-19 DIAGNOSIS — I10 Essential (primary) hypertension: Secondary | ICD-10-CM | POA: Diagnosis not present

## 2017-04-19 DIAGNOSIS — F17200 Nicotine dependence, unspecified, uncomplicated: Secondary | ICD-10-CM | POA: Diagnosis not present
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
## 2017-07-31 ENCOUNTER — Encounter (HOSPITAL_COMMUNITY): Payer: Self-pay

## 2017-07-31 ENCOUNTER — Emergency Department (HOSPITAL_COMMUNITY)
Admission: EM | Admit: 2017-07-31 | Discharge: 2017-07-31 | Disposition: A | Discharge status: Home / Self Care | Payer: Medicare HMO | Attending: Emergency Medicine | Admitting: Emergency Medicine

## 2017-07-31 DIAGNOSIS — J069 Acute upper respiratory infection, unspecified: Secondary | ICD-10-CM | POA: Diagnosis not present

## 2017-07-31 DIAGNOSIS — F17200 Nicotine dependence, unspecified, uncomplicated: Secondary | ICD-10-CM | POA: Insufficient documentation

## 2017-07-31 DIAGNOSIS — F319 Bipolar disorder, unspecified: Secondary | ICD-10-CM | POA: Diagnosis not present

## 2017-07-31 DIAGNOSIS — J45909 Unspecified asthma, uncomplicated: Secondary | ICD-10-CM | POA: Insufficient documentation

## 2017-07-31 DIAGNOSIS — I10 Essential (primary) hypertension: Secondary | ICD-10-CM | POA: Insufficient documentation

## 2017-07-31 DIAGNOSIS — B9789 Other viral agents as the cause of diseases classified elsewhere: Secondary | ICD-10-CM

## 2017-07-31 DIAGNOSIS — R05 Cough: Secondary | ICD-10-CM | POA: Diagnosis present

## 2017-07-31 MED ORDER — HYDROCHLOROTHIAZIDE 25 MG PO TABS: 25.0000 mg | ORAL_TABLET | Freq: Every day | ORAL | 0 refills | Status: AC

## 2017-07-31 MED ORDER — BENZONATATE 100 MG PO CAPS: 100.0000 mg | ORAL_CAPSULE | Freq: Three times a day (TID) | ORAL | 0 refills | Status: AC

## 2017-07-31 NOTE — ED Provider Notes (Signed)
[facility]-EMERGENCY DEPT Provider Note   CSN: [id number] Arrival date & time: 07/31/17  1612     History   Chief Complaint Chief Complaint  Patient presents with  . Cough  . Nasal Congestion    HPI Stuart Bell is a 56 y.o. male.  HPI   56 year old male presents today with complaints of upper respiratory infection.  Patient notes over the last 2 days he has had rhinorrhea, congestion, nonproductive cough.  Denies any shortness of breath, denies any fever.  He reports no medications prior to arrival.  Patient reports he smokes cigarettes.  Patient denies any other points today.  Past Medical History:  Diagnosis Date  . Asthma   . Bipolar 1 disorder (HCC) 2015  . Hypertension   . Substance abuse     There are no active problems to display for this patient.   History reviewed. No pertinent surgical history.     Home Medications    Prior to Admission medications   Medication Sig Start Date End Date Taking? Authorizing Provider  acetaminophen (TYLENOL) 500 MG tablet Take 1,000 mg by mouth every 6 (six) hours as needed for moderate pain or headache. Reported on 12/23/2015   Yes [provider]  ibuprofen (ADVIL,MOTRIN) 200 MG tablet Take 200 mg by mouth every 6 (six) hours as needed for headache or moderate pain.   Yes [provider]  benzonatate (TESSALON) 100 MG capsule Take 1 capsule (100 mg total) by mouth every 8 (eight) hours. 07/31/17   Camillo Quadros, Tinnie Gens, PA-C  cyclobenzaprine (FLEXERIL) 10 MG tablet Take 1 tablet (10 mg total) by mouth 2 (two) times daily as needed for muscle spasms. Patient not taking: Reported on 07/31/2017 01/16/16   Cheri Fowler, PA-C  Guaifenesin 1200 MG TB12 Take 1 tablet (1,200 mg total) by mouth 2 (two) times daily. Patient not taking: Reported on 07/31/2017 12/23/15   Charlestine Night, PA-C  hydrochlorothiazide (HYDRODIURIL) 25 MG tablet Take 1 tablet (25 mg total) by mouth daily. 07/31/17   Damir Leung, Tinnie Gens, PA-C  ibuprofen  (ADVIL,MOTRIN) 800 MG tablet Take 1 tablet (800 mg total) by mouth 3 (three) times daily. Patient not taking: Reported on 07/31/2017 01/16/16   Cheri Fowler, PA-C  promethazine-dextromethorphan (PROMETHAZINE-DM) 6.25-15 MG/5ML syrup Take 5 mLs by mouth 4 (four) times daily as needed for cough. Patient not taking: Reported on 07/31/2017 12/23/15   Charlestine Night, PA-C    Family History No family history on file.  Social History Social History  Substance Use Topics  . Smoking status: Light Tobacco Smoker  . Smokeless tobacco: Never Used  . Alcohol use 0.0 oz/week     Allergies   Patient has no known allergies.   Review of Systems Review of Systems  All other systems reviewed and are negative.    Physical Exam Updated Vital Signs BP (!) 148/87 (BP Location: Left Arm)   Pulse 96   Temp 99.5 F (37.5 C) (Oral)   Resp 16   Ht  (1.753 m)   Wt 77.1 kg (170 lb)   SpO2 100%   BMI 25.10 kg/m   Physical Exam  Constitutional: He is oriented to person, place, and time. He appears well-developed and well-nourished.  HENT:  Head: Normocephalic and atraumatic.  Eyes: Pupils are equal, round, and reactive to light. Conjunctivae are normal. Right eye exhibits no discharge. Left eye exhibits no discharge. No scleral icterus.  Neck: Normal range of motion. No JVD present. No tracheal deviation present.  Cardiovascular: Regular rhythm, normal heart  sounds and intact distal pulses.  Exam reveals no gallop and no friction rub.   No murmur heard. Pulmonary/Chest: Effort normal and breath sounds normal. No stridor. No respiratory distress. He has no wheezes. He has no rales.  Neurological: He is alert and oriented to person, place, and time. Coordination normal.  Psychiatric: He has a normal mood and affect. His behavior is normal. Judgment and thought content normal.  Nursing note and vitals reviewed.    ED Treatments / Results  Labs (all labs ordered are listed, but only  abnormal results are displayed) Labs Reviewed - No data to display  EKG  EKG Interpretation None       Radiology No results found.  Procedures Procedures (including critical care time)  Discussed smoking cessation with patient and was they were offerred resources to help stop.  Total time was 5 min CPT code [id number].   Medications Ordered in ED Medications - No data to display   Initial Impression / Assessment and Plan / ED Course  I have reviewed the triage vital signs and the nursing notes.  Pertinent labs & imaging results that were available during my care of the patient were reviewed by me and considered in my medical decision making (see chart for details).      Final Clinical Impressions(s) / ED Diagnoses   Final diagnoses:  Viral URI with cough  Hypertension, unspecified type    56 year old male presents today with complaints of upper respiratory infection.  This is likely viral in nature.  He is afebrile nontoxic.  He has no complaints of pain, only complaining of cough and nasal congestion.  Patient with clear lung sounds low suspicion for significant bacterial infection.  Patient will be discharged home with cough medication, close primary care follow-up and strict return precautions.  Patient also requesting refill of his hypertensive medication as he reports that he lost it.  Patient mildly hypertensive here, patient will be given a prescription for this with understanding and he will follow-up with his primary care..  Patient had no further questions or concerns at the time discharge.  New Prescriptions New Prescriptions   BENZONATATE (TESSALON) 100 MG CAPSULE    Take 1 capsule (100 mg total) by mouth every 8 (eight) hours.   HYDROCHLOROTHIAZIDE (HYDRODIURIL) 25 MG TABLET    Take 1 tablet (25 mg total) by mouth daily.     Eyvonne MechanicHedges, Diem Dicocco, PA-C 07/31/17 1912    Mancel BaleWentz, Elliott, MD 07/31/17 [phone]

## 2017-07-31 NOTE — ED Triage Notes (Signed)
Pt reports nasal congestion and non productive cough since Friday. Pt also endorses left neck, shoulder, and arm pain down to elbow x 1 week. Denies chest or back pain.

## 2017-07-31 NOTE — Discharge Instructions (Signed)
Please read attached information. If you experience any new or worsening signs or symptoms please return to the emergency room for evaluation. Please follow-up with your primary care provider or specialist as discussed. Please use medication prescribed only as directed and discontinue taking if you have any concerning signs or symptoms.   °

## 2017-07-31 NOTE — ED Notes (Signed)
PT states understanding of care given, follow up care, and medication prescribed. PT ambulated from ED to car with a steady gait. 

## 2017-07-31 NOTE — ED Notes (Signed)
Pt ambulates to room with steady gait

## 2017-10-08 LAB — GLUCOSE, POCT (MANUAL RESULT ENTRY): POC GLUCOSE: 137 mg/dL — AB (ref 70–99)

## 2018-01-14 IMAGING — CR DG CHEST 2V
2 series · 2 of 2 positions shown · non-contrast
Comparison: 11/08/2012.

CLINICAL DATA: 54-year-old male with chest pain productive cough
dizziness and fever since last night. Initial encounter.

EXAM:
CHEST  2 VIEW

[chest pa]
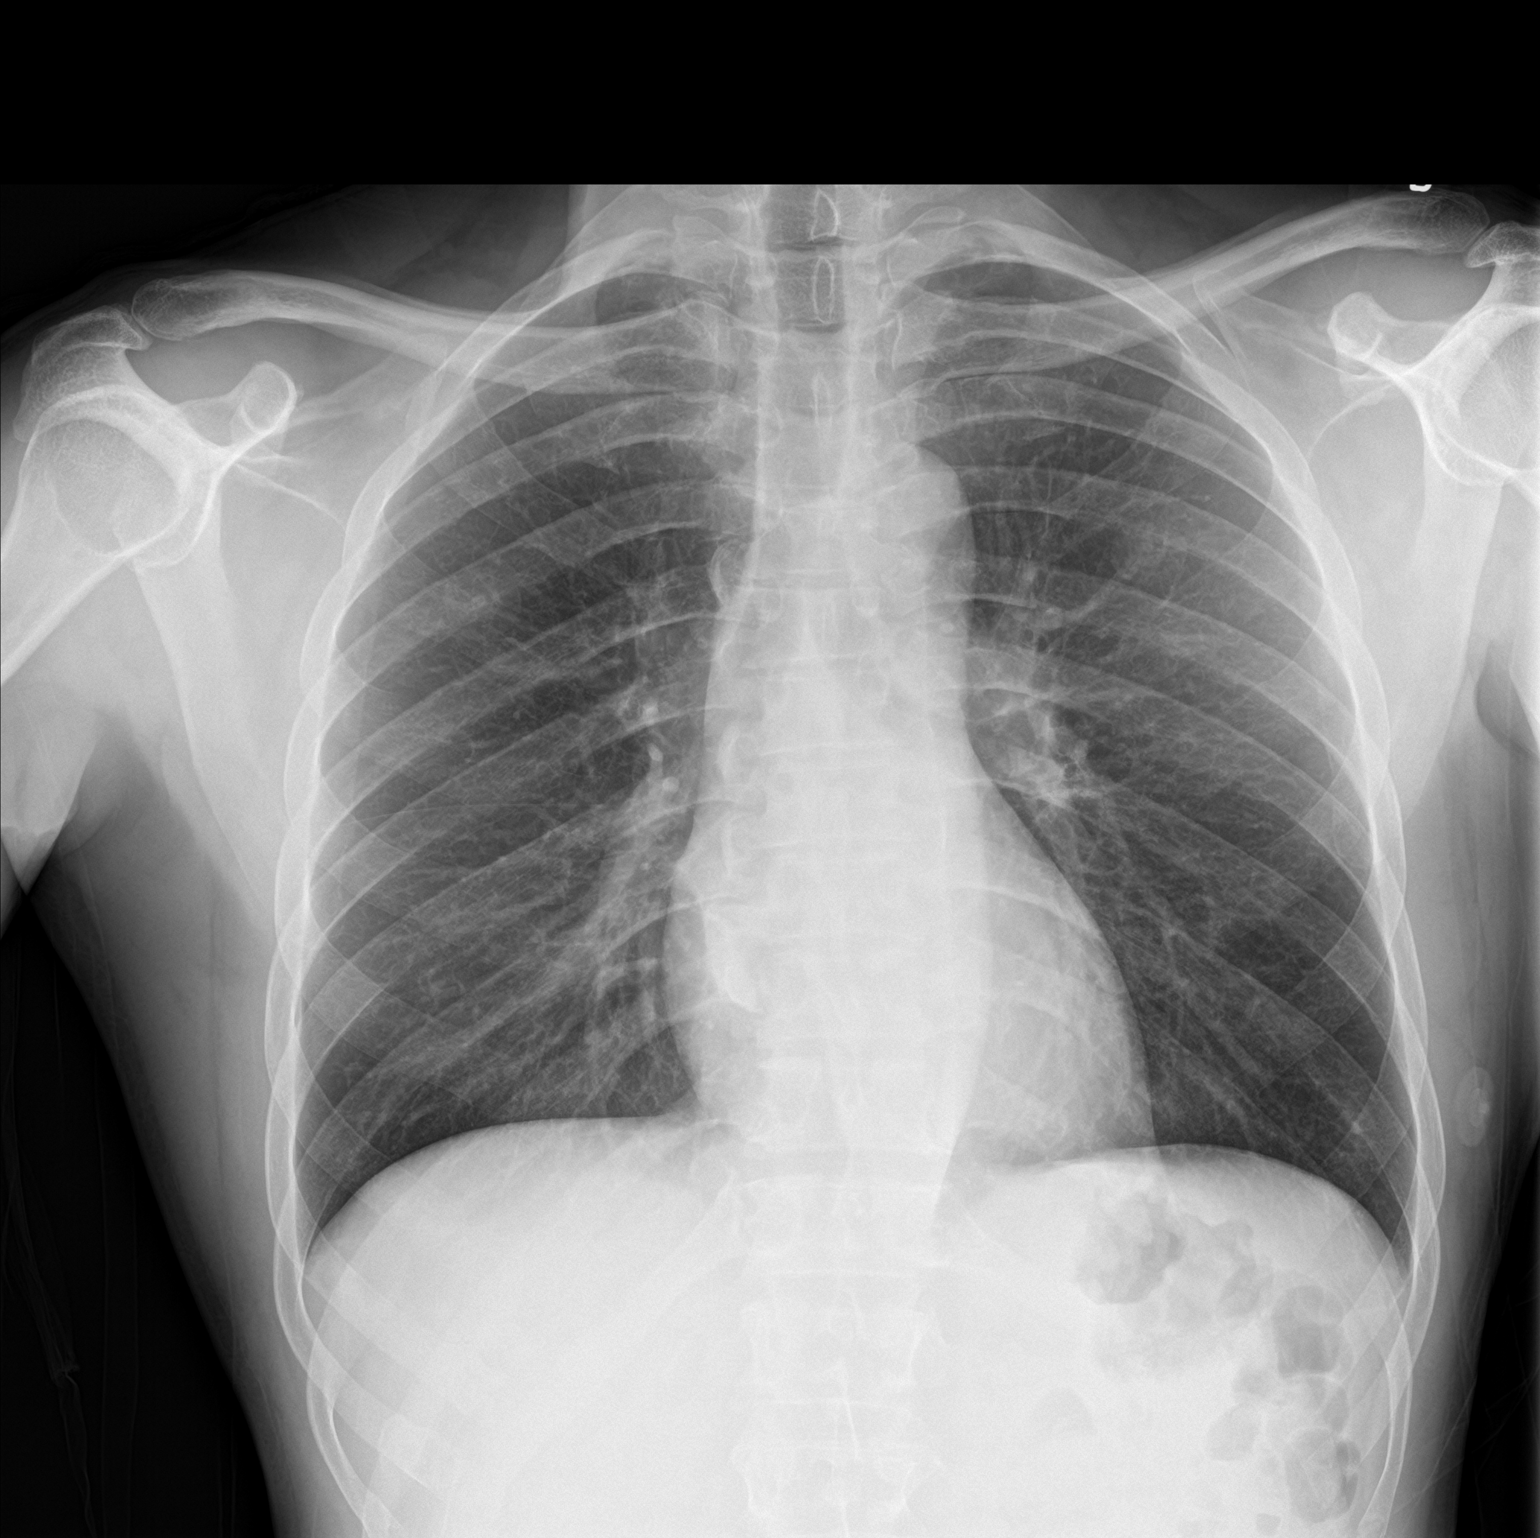

[chest lat]
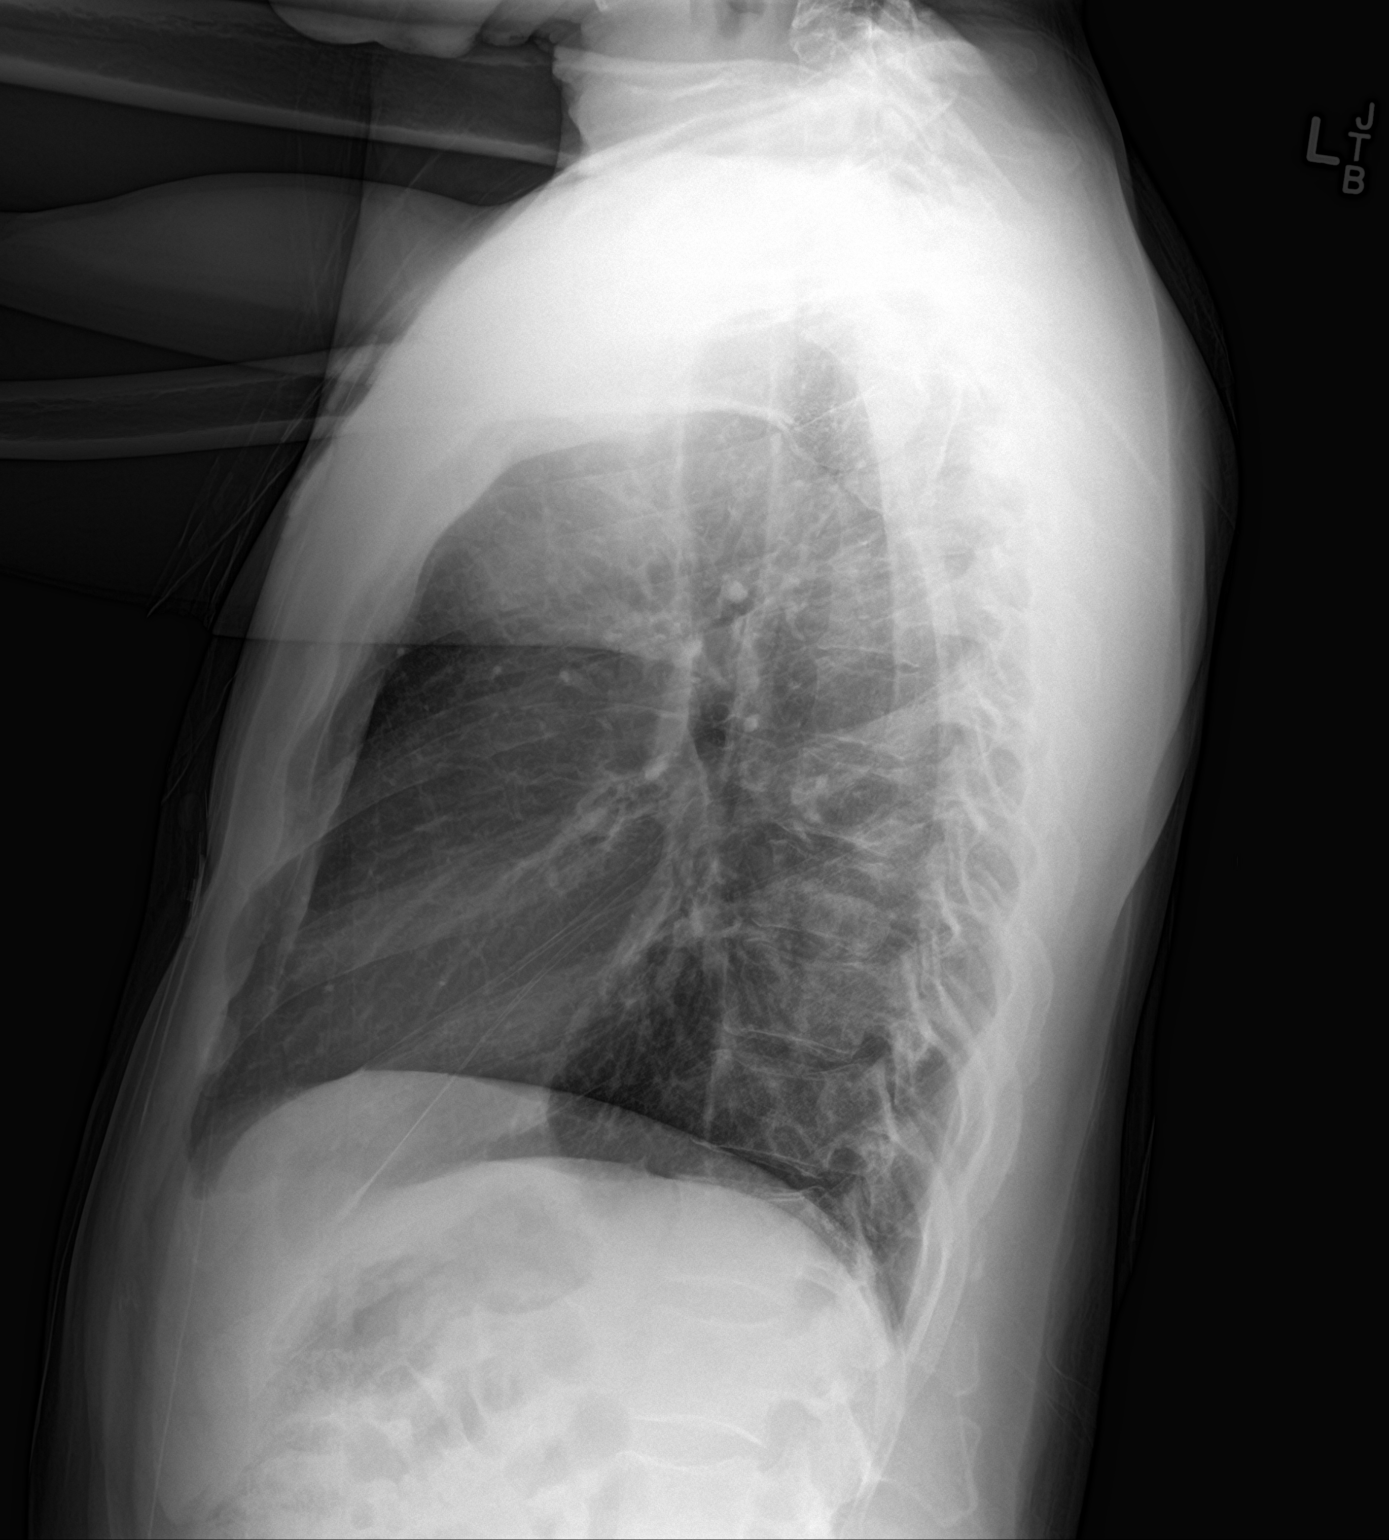

[2 of 2 positions shown; findings below may reference images not displayed]

FINDINGS: Lung volumes are stable and within normal limits. Normal cardiac
size and mediastinal contours. Visualized tracheal air column is
within normal limits. EKG button artifact over both upper lobes an
the left lung base. The lungs appear clear with no pneumothorax,
pulmonary edema, pleural effusion or confluent pulmonary opacity. No
acute osseous abnormality identified.
IMPRESSION: Negative, no acute cardiopulmonary abnormality.

## 2018-02-02 ENCOUNTER — Encounter (HOSPITAL_COMMUNITY): Payer: Self-pay | Admitting: *Deleted

## 2018-02-02 ENCOUNTER — Emergency Department (HOSPITAL_COMMUNITY)
Admission: EM | Admit: 2018-02-02 | Discharge: 2018-02-02 | Disposition: A | Discharge status: Home / Self Care | Payer: Medicare HMO | Attending: Physician Assistant | Admitting: Physician Assistant

## 2018-02-02 DIAGNOSIS — R079 Chest pain, unspecified: Secondary | ICD-10-CM | POA: Diagnosis present

## 2018-02-02 DIAGNOSIS — I10 Essential (primary) hypertension: Secondary | ICD-10-CM | POA: Diagnosis not present

## 2018-02-02 DIAGNOSIS — J45909 Unspecified asthma, uncomplicated: Secondary | ICD-10-CM | POA: Diagnosis not present

## 2018-02-02 DIAGNOSIS — Z79899 Other long term (current) drug therapy: Secondary | ICD-10-CM | POA: Diagnosis not present

## 2018-02-02 DIAGNOSIS — F17200 Nicotine dependence, unspecified, uncomplicated: Secondary | ICD-10-CM | POA: Diagnosis not present

## 2018-02-02 LAB — HEPATIC FUNCTION PANEL
ALBUMIN: 4.4 g/dL (ref 3.5–5.0)
ALK PHOS: 43 U/L (ref 38–126)
ALT: 23 U/L (ref 17–63)
AST: 30 U/L (ref 15–41)
BILIRUBIN TOTAL: 0.9 mg/dL (ref 0.3–1.2)
Bilirubin, Direct: 0.3 mg/dL (ref 0.1–0.5)
Indirect Bilirubin: 0.6 mg/dL (ref 0.3–0.9)
Total Protein: 8.3 g/dL — ABNORMAL HIGH (ref 6.5–8.1)

## 2018-02-02 LAB — BASIC METABOLIC PANEL
ANION GAP: 13 (ref 5–15)
BUN: 16 mg/dL (ref 6–20)
CO2: 22 mmol/L (ref 22–32)
Calcium: 9.3 mg/dL (ref 8.9–10.3)
Chloride: 99 mmol/L — ABNORMAL LOW (ref 101–111)
Creatinine, Ser: 0.96 mg/dL (ref 0.61–1.24)
GFR calc Af Amer: >60 mL/min (ref >60)
GLUCOSE: 99 mg/dL (ref 65–99)
POTASSIUM: 3.8 mmol/L (ref 3.5–5.1)
SODIUM: 134 mmol/L — AB (ref 135–145)

## 2018-02-02 LAB — CBC
HEMATOCRIT: 44.6 % (ref 39.0–52.0)
HEMOGLOBIN: 15.9 g/dL (ref 13.0–17.0)
MCH: 32.3 pg (ref 26.0–34.0)
MCHC: 35.7 g/dL (ref 30.0–36.0)
MCV: 90.7 fL (ref 78.0–100.0)
Platelets: 217 10*3/uL (ref 150–400)
RBC: 4.92 MIL/uL (ref 4.22–5.81)
RDW: 12.6 % (ref 11.5–15.5)
WBC: 5.4 10*3/uL (ref 4.0–10.5)

## 2018-02-02 LAB — I-STAT TROPONIN, ED
TROPONIN I, POC: 0 ng/mL (ref 0.00–0.08)
Troponin i, poc: 0 ng/mL (ref 0.00–0.08)

## 2018-02-02 LAB — ETHANOL: Alcohol, Ethyl (B): 277 mg/dL — ABNORMAL HIGH (ref <10)

## 2018-02-02 LAB — LIPASE, BLOOD: Lipase: 35 U/L (ref 11–51)

## 2018-02-02 NOTE — ED Notes (Signed)
Pt was called to update vitals, no answer.

## 2018-02-02 NOTE — ED Notes (Signed)
ED Provider at bedside. 
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
## 2018-02-02 NOTE — Discharge Instructions (Signed)
We are unsure what is causing your chest pain.  Please return with any concerns.

## 2018-02-02 NOTE — ED Notes (Signed)
Pt reports that he has been sleeping in lobby and did not hear his name called.
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
## 2018-02-16 ENCOUNTER — Encounter (HOSPITAL_COMMUNITY): Payer: Self-pay

## 2018-02-16 ENCOUNTER — Emergency Department (HOSPITAL_COMMUNITY): Payer: Medicare HMO

## 2018-02-16 ENCOUNTER — Emergency Department (HOSPITAL_COMMUNITY)
Admission: EM | Admit: 2018-02-16 | Discharge: 2018-02-16 | Disposition: A | Discharge status: Home / Self Care | Payer: Medicare HMO | Attending: Emergency Medicine | Admitting: Emergency Medicine

## 2018-02-16 DIAGNOSIS — G8929 Other chronic pain: Secondary | ICD-10-CM | POA: Diagnosis not present

## 2018-02-16 DIAGNOSIS — M545 Low back pain, unspecified: Secondary | ICD-10-CM

## 2018-02-16 DIAGNOSIS — R05 Cough: Secondary | ICD-10-CM | POA: Diagnosis present

## 2018-02-16 DIAGNOSIS — M549 Dorsalgia, unspecified: Secondary | ICD-10-CM | POA: Diagnosis not present

## 2018-02-16 DIAGNOSIS — J4520 Mild intermittent asthma, uncomplicated: Secondary | ICD-10-CM | POA: Insufficient documentation

## 2018-02-16 DIAGNOSIS — J302 Other seasonal allergic rhinitis: Secondary | ICD-10-CM | POA: Insufficient documentation

## 2018-02-16 MED ORDER — LORATADINE 10 MG PO TABS: 10.0000 mg | ORAL_TABLET | Freq: Every day | ORAL | 0 refills | Status: AC

## 2018-02-16 MED ORDER — ALBUTEROL SULFATE HFA 108 (90 BASE) MCG/ACT IN AERS
2.0000 | INHALATION_SPRAY | Freq: Once | RESPIRATORY_TRACT | Status: AC
  Administered 2018-02-16: 2 via RESPIRATORY_TRACT
  Filled 2018-02-16: qty 6.7

## 2018-02-16 NOTE — ED Provider Notes (Signed)
[facility] EMERGENCY DEPARTMENT Provider Note   CSN: [id number] Arrival date & time: 02/16/18  4098     History   Chief Complaint Chief Complaint  Patient presents with  . Back Pain/cough    HPI Stuart Bell is a 57 y.o. male w PMHx asthma, bipolar 1 disorder, substance abuse, hypertension, presenting to the ED with 1 month of cough and rhinorrhea, as well as chronic low back pain.  Patient states cough is intermittent and dry, however has been persistent for the past month.  He states he has not had his albuterol inhaler for some time, as he has lost it. Reports intermittent assoc wheezing. Endorses rhinorrhea as well.  Has not taken any medications for symptoms.  Patient also reports chronic low back pain, no recent injuries.  Ibuprofen does not provide enough relief for his symptoms.  Has not followed up with his PCP regarding his back pain.  Denies difficulty breathing or swallowing, sore throat, fevers, ear pain, numbness or weakness in extremities, bowel or bladder incontinence.  Recent injuries.  The history is provided by the patient.    Past Medical History:  Diagnosis Date  . Asthma   . Bipolar 1 disorder (HCC) 2015  . Hypertension   . Substance abuse (HCC)     There are no active problems to display for this patient.   History reviewed. No pertinent surgical history.      Home Medications    Prior to Admission medications   Medication Sig Start Date End Date Taking? Authorizing Provider  acetaminophen (TYLENOL) 500 MG tablet Take 1,000 mg by mouth every 6 (six) hours as needed for moderate pain or headache. Reported on 12/23/2015    [provider]  benzonatate (TESSALON) 100 MG capsule Take 1 capsule (100 mg total) by mouth every 8 (eight) hours. 07/31/17   Hedges, Tinnie Gens, PA-C  cyclobenzaprine (FLEXERIL) 10 MG tablet Take 1 tablet (10 mg total) by mouth 2 (two) times daily as needed for muscle spasms. Patient not taking: Reported  on 07/31/2017 01/16/16   Cheri Fowler, PA-C  Guaifenesin 1200 MG TB12 Take 1 tablet (1,200 mg total) by mouth 2 (two) times daily. Patient not taking: Reported on 07/31/2017 12/23/15   Charlestine Night, PA-C  hydrochlorothiazide (HYDRODIURIL) 25 MG tablet Take 1 tablet (25 mg total) by mouth daily. 07/31/17   Hedges, Tinnie Gens, PA-C  ibuprofen (ADVIL,MOTRIN) 200 MG tablet Take 200 mg by mouth every 6 (six) hours as needed for headache or moderate pain.    [provider]  ibuprofen (ADVIL,MOTRIN) 800 MG tablet Take 1 tablet (800 mg total) by mouth 3 (three) times daily. Patient not taking: Reported on 07/31/2017 01/16/16   Cheri Fowler, PA-C  loratadine (CLARITIN) 10 MG tablet Take 1 tablet (10 mg total) by mouth daily. 02/16/18   Stasha Naraine, [place] N, PA-C  promethazine-dextromethorphan (PROMETHAZINE-DM) 6.25-15 MG/5ML syrup Take 5 mLs by mouth 4 (four) times daily as needed for cough. Patient not taking: Reported on 07/31/2017 12/23/15   Charlestine Night, PA-C    Family History No family history on file.  Social History Social History   Tobacco Use  . Smoking status: Light Tobacco Smoker  . Smokeless tobacco: Never Used  Substance Use Topics  . Alcohol use: Yes    Alcohol/week: 0.0 oz  . Drug use: Yes    Types: Cocaine     Allergies   Patient has no known allergies.   Review of Systems Review of Systems  Constitutional: Negative for chills  and fever.  HENT: Positive for rhinorrhea. Negative for congestion, ear pain and sore throat.   Respiratory: Positive for cough and wheezing. Negative for shortness of breath.   Gastrointestinal:       No bowel incontinence  Genitourinary: Negative for difficulty urinating.  Musculoskeletal: Positive for back pain.  Neurological: Negative for weakness and numbness.  All other systems reviewed and are negative.    Physical Exam Updated Vital Signs BP 113/77 (BP Location: Right Arm)   Pulse 96   Temp 98.2 F (36.8 C) (Oral)   Resp  20   SpO2 96%   Physical Exam  Constitutional: He appears well-developed and well-nourished. No distress.  HENT:  Head: Normocephalic and atraumatic.  Mouth/Throat: Oropharynx is clear and moist.  Eyes: Conjunctivae are normal.  Neck: Normal range of motion. Neck supple.  Cardiovascular: Normal rate, regular rhythm, normal heart sounds and intact distal pulses.  Pulmonary/Chest: Effort normal and breath sounds normal. No stridor. No respiratory distress. He has no wheezes. He has no rales.  Normal work of breathing  Abdominal: Soft.  Musculoskeletal:  Lateral lower back tenderness.  No midline spinal or paraspinal tenderness, no bony step-offs or gross deformities.  Moving all extremities.  Normal strength and sensation.  Lymphadenopathy:    He has no cervical adenopathy.  Neurological: He is alert.  Skin: Skin is warm.  Psychiatric: He has a normal mood and affect. His behavior is normal.  Nursing note and vitals reviewed.    ED Treatments / Results  Labs (all labs ordered are listed, but only abnormal results are displayed) Labs Reviewed - No data to display  EKG None  Radiology Dg Chest 2 View  Result Date: 02/16/2018 CLINICAL DATA:  Cough for a week EXAM: CHEST - 2 VIEW COMPARISON:  12/23/2015 FINDINGS: Normal heart size and aortic contours. Right paraspinal density over the lower mediastinum is long-standing and attributed to far-lateral endplate spur. There is no edema, consolidation, effusion, or pneumothorax. IMPRESSION: No evidence of active disease. Electronically Signed   By: Marnee Spring M.D.   On: 02/16/2018 09:57    Procedures Procedures (including critical care time)  Medications Ordered in ED Medications  albuterol (PROVENTIL HFA;VENTOLIN HFA) 108 (90 Base) MCG/ACT inhaler 2 puff (has no administration in time range)     Initial Impression / Assessment and Plan / ED Course  I have reviewed the triage vital signs and the nursing notes.  Pertinent  labs & imaging results that were available during my care of the patient were reviewed by me and considered in my medical decision making (see chart for details).     Patient with symptoms consistent with seasonal allergies and asthma.  Patient also with chronic bilateral low back pain.  Lungs are clear.  Normal work of breathing, no respiratory distress.  Vital signs stable. O2 sat 96% on RA. Chest x-ray negative for infiltrate.  Normal neurologic exam, no red flags.  Will treat symptomatically with allergy medication, prescribe albuterol inhaler, and recommend OTC pain medications for chronic back pain.  Discussed PCP follow-up for chronic back pain and management of asthma.  Discussed results, findings, treatment and follow up. Patient advised of return precautions. Patient verbalized understanding and agreed with plan.  Final Clinical Impressions(s) / ED Diagnoses   Final diagnoses:  Chronic bilateral low back pain without sciatica  Seasonal allergic rhinitis, unspecified trigger  Mild intermittent asthma without complication    ED Discharge Orders        Ordered  loratadine (CLARITIN) 10 MG tablet  Daily     02/16/18 1049       Yuriel Lopezmartinez, [place] N, [place]PA-C 02/16/18 1049    Doug SouJacubowitz, Sam, MD 02/16/18 1204

## 2018-02-16 NOTE — Discharge Instructions (Addendum)
Please read instructions below. Apply ice to your back for 20 minutes at a time. You can take ibuprofen every 6 hours as needed for pain. Use your albuterol inhaler every 4-6 hours as needed for wheezing or cough. You can take claritin daily for runny nose and cough. You can get this medication over the counter as well. Schedule an appointment with your primary care provider for management of your chronic back pain and asthma.  Return to the ER for new or concerning symptoms.

## 2018-02-16 NOTE — ED Triage Notes (Signed)
Patient complains of thoracic back pain ongoing for weeks, pain with any ROM. Also reports that he has cough and out of inhaler, NAD

## 2018-05-04 LAB — GLUCOSE, POCT (MANUAL RESULT ENTRY): POC GLUCOSE: 118 mg/dL — AB (ref 70–99)
